# Patient Record
Sex: Male | Born: 1940 | Race: White | Hispanic: No | Marital: Married | Smoking: Never smoker
Health system: Southern US, Community
[De-identification: ages and names within clinical notes are randomized; demographics above are authoritative.]

## PROBLEM LIST (undated history)

## (undated) DIAGNOSIS — K2100 Gastro-esophageal reflux disease with esophagitis, without bleeding: Secondary | ICD-10-CM

## (undated) DIAGNOSIS — E7800 Pure hypercholesterolemia, unspecified: Secondary | ICD-10-CM

## (undated) DIAGNOSIS — R413 Other amnesia: Secondary | ICD-10-CM

## (undated) DIAGNOSIS — G250 Essential tremor: Secondary | ICD-10-CM

## (undated) DIAGNOSIS — F419 Anxiety disorder, unspecified: Secondary | ICD-10-CM

## (undated) DIAGNOSIS — N400 Enlarged prostate without lower urinary tract symptoms: Secondary | ICD-10-CM

## (undated) DIAGNOSIS — B009 Herpesviral infection, unspecified: Secondary | ICD-10-CM

## (undated) DIAGNOSIS — I10 Essential (primary) hypertension: Secondary | ICD-10-CM

## (undated) DIAGNOSIS — E119 Type 2 diabetes mellitus without complications: Secondary | ICD-10-CM

## (undated) DIAGNOSIS — R32 Unspecified urinary incontinence: Secondary | ICD-10-CM

## (undated) DIAGNOSIS — I4891 Unspecified atrial fibrillation: Secondary | ICD-10-CM

---

## 2009-09-14 ENCOUNTER — Emergency Department: Payer: Self-pay | Admitting: Emergency Medicine

## 2017-09-14 ENCOUNTER — Inpatient Hospital Stay
Admission: EM | Admit: 2017-09-14 | Discharge: 2017-09-16 | DRG: 309 | Disposition: A | Discharge status: Home / Self Care | Payer: Medicare Other | Attending: Internal Medicine | Admitting: Internal Medicine

## 2017-09-14 ENCOUNTER — Encounter: Payer: Self-pay | Admitting: Emergency Medicine

## 2017-09-14 ENCOUNTER — Emergency Department: Payer: Medicare Other

## 2017-09-14 ENCOUNTER — Other Ambulatory Visit: Payer: Self-pay

## 2017-09-14 DIAGNOSIS — Z7901 Long term (current) use of anticoagulants: Secondary | ICD-10-CM | POA: Diagnosis not present

## 2017-09-14 DIAGNOSIS — Z79899 Other long term (current) drug therapy: Secondary | ICD-10-CM | POA: Diagnosis not present

## 2017-09-14 DIAGNOSIS — E119 Type 2 diabetes mellitus without complications: Secondary | ICD-10-CM | POA: Diagnosis present

## 2017-09-14 DIAGNOSIS — I361 Nonrheumatic tricuspid (valve) insufficiency: Secondary | ICD-10-CM | POA: Diagnosis not present

## 2017-09-14 DIAGNOSIS — I248 Other forms of acute ischemic heart disease: Secondary | ICD-10-CM | POA: Diagnosis present

## 2017-09-14 DIAGNOSIS — I10 Essential (primary) hypertension: Secondary | ICD-10-CM | POA: Diagnosis present

## 2017-09-14 DIAGNOSIS — R778 Other specified abnormalities of plasma proteins: Secondary | ICD-10-CM

## 2017-09-14 DIAGNOSIS — K210 Gastro-esophageal reflux disease with esophagitis: Secondary | ICD-10-CM | POA: Diagnosis present

## 2017-09-14 DIAGNOSIS — N400 Enlarged prostate without lower urinary tract symptoms: Secondary | ICD-10-CM | POA: Diagnosis present

## 2017-09-14 DIAGNOSIS — E785 Hyperlipidemia, unspecified: Secondary | ICD-10-CM | POA: Diagnosis present

## 2017-09-14 DIAGNOSIS — G4733 Obstructive sleep apnea (adult) (pediatric): Secondary | ICD-10-CM | POA: Diagnosis present

## 2017-09-14 DIAGNOSIS — R05 Cough: Secondary | ICD-10-CM | POA: Diagnosis present

## 2017-09-14 DIAGNOSIS — Z7984 Long term (current) use of oral hypoglycemic drugs: Secondary | ICD-10-CM | POA: Diagnosis not present

## 2017-09-14 DIAGNOSIS — I4891 Unspecified atrial fibrillation: Secondary | ICD-10-CM

## 2017-09-14 DIAGNOSIS — I480 Paroxysmal atrial fibrillation: Secondary | ICD-10-CM | POA: Diagnosis not present

## 2017-09-14 DIAGNOSIS — R7989 Other specified abnormal findings of blood chemistry: Secondary | ICD-10-CM

## 2017-09-14 DIAGNOSIS — G250 Essential tremor: Secondary | ICD-10-CM | POA: Diagnosis present

## 2017-09-14 DIAGNOSIS — R059 Cough, unspecified: Secondary | ICD-10-CM

## 2017-09-14 DIAGNOSIS — J069 Acute upper respiratory infection, unspecified: Secondary | ICD-10-CM | POA: Diagnosis present

## 2017-09-14 DIAGNOSIS — R079 Chest pain, unspecified: Secondary | ICD-10-CM

## 2017-09-14 DIAGNOSIS — E7800 Pure hypercholesterolemia, unspecified: Secondary | ICD-10-CM | POA: Diagnosis present

## 2017-09-14 HISTORY — DX: Benign prostatic hyperplasia without lower urinary tract symptoms: N40.0

## 2017-09-14 HISTORY — DX: Unspecified urinary incontinence: R32

## 2017-09-14 HISTORY — DX: Type 2 diabetes mellitus without complications: E11.9

## 2017-09-14 HISTORY — DX: Herpesviral infection, unspecified: B00.9

## 2017-09-14 HISTORY — DX: Gastro-esophageal reflux disease with esophagitis: K21.0

## 2017-09-14 HISTORY — DX: Essential tremor: G25.0

## 2017-09-14 HISTORY — DX: Anxiety disorder, unspecified: F41.9

## 2017-09-14 HISTORY — DX: Other amnesia: R41.3

## 2017-09-14 HISTORY — DX: Pure hypercholesterolemia, unspecified: E78.00

## 2017-09-14 HISTORY — DX: Gastro-esophageal reflux disease with esophagitis, without bleeding: K21.00

## 2017-09-14 HISTORY — DX: Essential (primary) hypertension: I10

## 2017-09-14 HISTORY — DX: Unspecified atrial fibrillation: I48.91

## 2017-09-14 LAB — BASIC METABOLIC PANEL
Anion gap: 11 (ref 5–15)
BUN: 18 mg/dL (ref 6–20)
CO2: 26 mmol/L (ref 22–32)
CREATININE: 1.38 mg/dL — AB (ref 0.61–1.24)
Calcium: 8.6 mg/dL — ABNORMAL LOW (ref 8.9–10.3)
Chloride: 100 mmol/L — ABNORMAL LOW (ref 101–111)
GFR calc Af Amer: 56 mL/min — ABNORMAL LOW (ref >60)
GFR, EST NON AFRICAN AMERICAN: 48 mL/min — AB (ref >60)
Glucose, Bld: 124 mg/dL — ABNORMAL HIGH (ref 65–99)
Potassium: 3.7 mmol/L (ref 3.5–5.1)
SODIUM: 137 mmol/L (ref 135–145)

## 2017-09-14 LAB — URINALYSIS, COMPLETE (UACMP) WITH MICROSCOPIC
Bilirubin Urine: NEGATIVE
Glucose, UA: 50 mg/dL — AB
Hgb urine dipstick: NEGATIVE
KETONES UR: 5 mg/dL — AB
Leukocytes, UA: NEGATIVE
Nitrite: NEGATIVE
PROTEIN: 100 mg/dL — AB
SQUAMOUS EPITHELIAL / LPF: NONE SEEN
Specific Gravity, Urine: 1.009 (ref 1.005–1.030)
pH: 8 (ref 5.0–8.0)

## 2017-09-14 LAB — GLUCOSE, CAPILLARY
GLUCOSE-CAPILLARY: 120 mg/dL — AB (ref 65–99)
Glucose-Capillary: 107 mg/dL — ABNORMAL HIGH (ref 65–99)

## 2017-09-14 LAB — CBC WITH DIFFERENTIAL/PLATELET
Basophils Absolute: 0.1 10*3/uL (ref 0–0.1)
Basophils Relative: 0 %
Eosinophils Absolute: 0 10*3/uL (ref 0–0.7)
Eosinophils Relative: 0 %
HCT: 42.6 % (ref 40.0–52.0)
HEMOGLOBIN: 14.4 g/dL (ref 13.0–18.0)
LYMPHS ABS: 1.3 10*3/uL (ref 1.0–3.6)
LYMPHS PCT: 9 %
MCH: 32.9 pg (ref 26.0–34.0)
MCHC: 33.8 g/dL (ref 32.0–36.0)
MCV: 97.3 fL (ref 80.0–100.0)
Monocytes Absolute: 1 10*3/uL (ref 0.2–1.0)
Monocytes Relative: 7 %
Neutro Abs: 12.3 10*3/uL — ABNORMAL HIGH (ref 1.4–6.5)
Neutrophils Relative %: 84 %
PLATELETS: 200 10*3/uL (ref 150–440)
RBC: 4.38 MIL/uL — AB (ref 4.40–5.90)
RDW: 14.4 % (ref 11.5–14.5)
WBC: 14.7 10*3/uL — AB (ref 3.8–10.6)

## 2017-09-14 LAB — BRAIN NATRIURETIC PEPTIDE: B NATRIURETIC PEPTIDE 5: 1021 pg/mL — AB (ref 0.0–100.0)

## 2017-09-14 LAB — TROPONIN I
Troponin I: 0.05 ng/mL (ref <0.03)
Troponin I: 0.38 ng/mL (ref <0.03)
Troponin I: 0.38 ng/mL (ref <0.03)

## 2017-09-14 LAB — TSH: TSH: 2.502 u[IU]/mL (ref 0.350–4.500)

## 2017-09-14 LAB — MRSA PCR SCREENING: MRSA by PCR: NEGATIVE

## 2017-09-14 MED ORDER — MORPHINE SULFATE (PF) 2 MG/ML IV SOLN: 2.0000 mg | INTRAVENOUS | Status: DC | PRN

## 2017-09-14 MED ORDER — HYDRALAZINE HCL 50 MG PO TABS
50.0000 mg | ORAL_TABLET | Freq: Three times a day (TID) | ORAL | Status: DC
  Administered 2017-09-14 – 2017-09-16 (×6): 50 mg via ORAL
  Filled 2017-09-14 (×7): qty 1

## 2017-09-14 MED ORDER — NITROGLYCERIN 2 % TD OINT
1.0000 [in_us] | TOPICAL_OINTMENT | Freq: Four times a day (QID) | TRANSDERMAL | Status: DC
  Administered 2017-09-14 – 2017-09-15 (×3): 1 [in_us] via TOPICAL
  Filled 2017-09-14 (×4): qty 1

## 2017-09-14 MED ORDER — METOPROLOL SUCCINATE ER 50 MG PO TB24
50.0000 mg | ORAL_TABLET | Freq: Two times a day (BID) | ORAL | Status: DC
  Administered 2017-09-14 – 2017-09-15 (×2): 50 mg via ORAL
  Filled 2017-09-14 (×2): qty 1

## 2017-09-14 MED ORDER — ONDANSETRON HCL 4 MG/2ML IJ SOLN: 4.0000 mg | Freq: Four times a day (QID) | INTRAMUSCULAR | Status: DC | PRN

## 2017-09-14 MED ORDER — ACETAMINOPHEN 325 MG PO TABS: 650.0000 mg | ORAL_TABLET | Freq: Four times a day (QID) | ORAL | Status: DC | PRN

## 2017-09-14 MED ORDER — MAGNESIUM SULFATE 2 GM/50ML IV SOLN
2.0000 g | Freq: Once | INTRAVENOUS | Status: AC
  Administered 2017-09-14: 2 g via INTRAVENOUS
  Filled 2017-09-14: qty 50

## 2017-09-14 MED ORDER — DILTIAZEM HCL 100 MG IV SOLR
10.0000 mg/h | INTRAVENOUS | Status: DC
  Administered 2017-09-14 – 2017-09-15 (×2): 10 mg/h via INTRAVENOUS
  Filled 2017-09-14 (×4): qty 100

## 2017-09-14 MED ORDER — METFORMIN HCL 500 MG PO TABS
500.0000 mg | ORAL_TABLET | Freq: Every day | ORAL | Status: DC
  Administered 2017-09-15 – 2017-09-16 (×2): 500 mg via ORAL
  Filled 2017-09-14 (×2): qty 1

## 2017-09-14 MED ORDER — DOCUSATE SODIUM 100 MG PO CAPS
100.0000 mg | ORAL_CAPSULE | Freq: Two times a day (BID) | ORAL | Status: DC
  Administered 2017-09-14 – 2017-09-16 (×4): 100 mg via ORAL
  Filled 2017-09-14 (×4): qty 1

## 2017-09-14 MED ORDER — TAMSULOSIN HCL 0.4 MG PO CAPS
0.4000 mg | ORAL_CAPSULE | Freq: Two times a day (BID) | ORAL | Status: DC
  Administered 2017-09-14 – 2017-09-16 (×4): 0.4 mg via ORAL
  Filled 2017-09-14 (×4): qty 1

## 2017-09-14 MED ORDER — CITALOPRAM HYDROBROMIDE 20 MG PO TABS
40.0000 mg | ORAL_TABLET | Freq: Every day | ORAL | Status: DC
  Administered 2017-09-15 – 2017-09-16 (×2): 40 mg via ORAL
  Filled 2017-09-14 (×2): qty 2

## 2017-09-14 MED ORDER — PRIMIDONE 250 MG PO TABS
250.0000 mg | ORAL_TABLET | Freq: Two times a day (BID) | ORAL | Status: DC
  Administered 2017-09-14 – 2017-09-16 (×4): 250 mg via ORAL
  Filled 2017-09-14 (×6): qty 1

## 2017-09-14 MED ORDER — BISACODYL 10 MG RE SUPP: 10.0000 mg | Freq: Every day | RECTAL | Status: DC | PRN

## 2017-09-14 MED ORDER — OXYBUTYNIN CHLORIDE ER 5 MG PO TB24
5.0000 mg | ORAL_TABLET | Freq: Every day | ORAL | Status: DC
  Administered 2017-09-15 – 2017-09-16 (×2): 5 mg via ORAL
  Filled 2017-09-14 (×2): qty 1

## 2017-09-14 MED ORDER — LOSARTAN POTASSIUM 50 MG PO TABS
50.0000 mg | ORAL_TABLET | Freq: Two times a day (BID) | ORAL | Status: DC
  Administered 2017-09-14 – 2017-09-16 (×4): 50 mg via ORAL
  Filled 2017-09-14 (×4): qty 1

## 2017-09-14 MED ORDER — SODIUM CHLORIDE 0.9 % IV BOLUS (SEPSIS)
500.0000 mL | Freq: Once | INTRAVENOUS | Status: AC
  Administered 2017-09-14: 500 mL via INTRAVENOUS

## 2017-09-14 MED ORDER — PANTOPRAZOLE SODIUM 40 MG IV SOLR
40.0000 mg | Freq: Two times a day (BID) | INTRAVENOUS | Status: DC
  Administered 2017-09-14 – 2017-09-16 (×4): 40 mg via INTRAVENOUS
  Filled 2017-09-14 (×4): qty 40

## 2017-09-14 MED ORDER — NITROGLYCERIN 0.4 MG SL SUBL: 0.4000 mg | SUBLINGUAL_TABLET | SUBLINGUAL | Status: DC | PRN

## 2017-09-14 MED ORDER — INSULIN ASPART 100 UNIT/ML ~~LOC~~ SOLN
0.0000 [IU] | Freq: Three times a day (TID) | SUBCUTANEOUS | Status: DC
  Administered 2017-09-15: 1 [IU] via SUBCUTANEOUS
  Administered 2017-09-15 – 2017-09-16 (×2): 2 [IU] via SUBCUTANEOUS
  Filled 2017-09-14 (×3): qty 1

## 2017-09-14 MED ORDER — RIVAROXABAN 20 MG PO TABS
20.0000 mg | ORAL_TABLET | Freq: Every day | ORAL | Status: DC
  Administered 2017-09-15 – 2017-09-16 (×2): 20 mg via ORAL
  Filled 2017-09-14 (×2): qty 1

## 2017-09-14 MED ORDER — METOPROLOL TARTRATE 5 MG/5ML IV SOLN
5.0000 mg | Freq: Once | INTRAVENOUS | Status: AC
  Administered 2017-09-14: 5 mg via INTRAVENOUS
  Filled 2017-09-14: qty 5

## 2017-09-14 MED ORDER — IPRATROPIUM-ALBUTEROL 0.5-2.5 (3) MG/3ML IN SOLN
3.0000 mL | Freq: Four times a day (QID) | RESPIRATORY_TRACT | Status: DC
  Administered 2017-09-14 – 2017-09-15 (×4): 3 mL via RESPIRATORY_TRACT
  Filled 2017-09-14 (×4): qty 3

## 2017-09-14 MED ORDER — EZETIMIBE 10 MG PO TABS
10.0000 mg | ORAL_TABLET | Freq: Every day | ORAL | Status: DC
  Administered 2017-09-15 – 2017-09-16 (×2): 10 mg via ORAL
  Filled 2017-09-14 (×2): qty 1

## 2017-09-14 MED ORDER — DILTIAZEM HCL 100 MG IV SOLR
5.0000 mg/h | Freq: Once | INTRAVENOUS | Status: AC
  Administered 2017-09-14: 5 mg/h via INTRAVENOUS
  Filled 2017-09-14: qty 100

## 2017-09-14 MED ORDER — ONDANSETRON HCL 4 MG PO TABS: 4.0000 mg | ORAL_TABLET | Freq: Four times a day (QID) | ORAL | Status: DC | PRN

## 2017-09-14 MED ORDER — ACETAMINOPHEN 650 MG RE SUPP: 650.0000 mg | Freq: Four times a day (QID) | RECTAL | Status: DC | PRN

## 2017-09-14 MED ORDER — FLECAINIDE ACETATE 50 MG PO TABS
50.0000 mg | ORAL_TABLET | Freq: Two times a day (BID) | ORAL | Status: DC
  Filled 2017-09-14: qty 1

## 2017-09-14 MED ORDER — DONEPEZIL HCL 5 MG PO TABS
10.0000 mg | ORAL_TABLET | Freq: Every day | ORAL | Status: DC
  Administered 2017-09-15 – 2017-09-16 (×2): 10 mg via ORAL
  Filled 2017-09-14 (×2): qty 2

## 2017-09-14 MED ORDER — POTASSIUM CHLORIDE IN NACL 20-0.9 MEQ/L-% IV SOLN
INTRAVENOUS | Status: DC
  Administered 2017-09-14: 17:00:00 via INTRAVENOUS
  Filled 2017-09-14 (×4): qty 1000

## 2017-09-14 NOTE — ED Provider Notes (Addendum)
[facility] Emergency Department Provider Note       Time seen: ----------------------------------------- 11:17 AM on 09/14/2017 -----------------------------------------   I have reviewed the triage vital signs and the nursing notes.  HISTORY   Chief Complaint URI    HPI Lucas Foster is a 76 y.o. male with a history of atrial fibrillation who presents to the ED for cough and cold symptoms.  [facility] and he is currently [place] from [place].  [facility] he was placed on Flonase and states he is not feeling any better.  He is still complaining of chest tightness and some cough.  EMS did note that his heart was intermittently racing, he reports taking all his medications today.  His symptoms have been present for 2 days.  No past medical history on file.  There is a reported history of atrial fibrillation, hypertension, anxiety, diabetes and hyperlipidemia  There are no active problems to display for this patient.     Allergies Patient has no allergy information on record.  Social History Social History   Tobacco Use  . Smoking status: Not on file  Substance Use Topics  . Alcohol use: Not on file  . Drug use: Not on file    Review of Systems Constitutional: Negative for fever. Eyes: Negative for vision changes ENT:  Negative for congestion, sore throat Cardiovascular: Negative for chest pain. Respiratory: Positive for cough and shortness of breath Gastrointestinal: Negative for abdominal pain, vomiting and diarrhea. Genitourinary: Negative for dysuria. Musculoskeletal: Negative for back pain. Skin: Negative for rash. Neurological: Negative for headaches, focal weakness or numbness.  All systems negative/normal/unremarkable except as stated in the HPI  ____________________________________________   PHYSICAL EXAM:  VITAL SIGNS: ED Triage Vitals  Enc Vitals Group     BP      Pulse      Resp      Temp       Temp src      SpO2      Weight      Height      Head Circumference      Peak Flow      Pain Score      Pain Loc      Pain Edu?      Excl. in GC?     Constitutional: Alert and oriented. Well appearing and in no distress. Eyes: Conjunctivae are normal. Normal extraocular movements. ENT   Head: Normocephalic and atraumatic.   Nose: No congestion/rhinnorhea.   Mouth/Throat: Mucous membranes are moist.   Neck: No stridor. Cardiovascular: Rapid rate, irregular rhythm. No murmurs, rubs, or gallops. Respiratory: Normal respiratory effort without tachypnea nor retractions. Breath sounds are clear and equal bilaterally. No wheezes/rales/rhonchi. Gastrointestinal: Soft and nontender. Normal bowel sounds Musculoskeletal: Nontender with normal range of motion in extremities. No lower extremity tenderness nor edema. Neurologic:  Normal speech and language. No gross focal neurologic deficits are appreciated.  Skin:  Skin is warm, dry and intact. No rash noted. Psychiatric: Mood and affect are normal. Speech and behavior are normal.  ____________________________________________  EKG: Interpreted by me.  A. fib with a rate of 138 bpm, LVH, left axis deviation  ____________________________________________  ED COURSE:  Pertinent labs & imaging results that were available during my care of the patient were reviewed by me and considered in my medical decision making (see chart for details). Patient presents for cough and weakness, we will assess with labs and imaging as indicated.  Procedures ____________________________________________   LABS (pertinent positives/negatives)  Labs Reviewed  CBC WITH DIFFERENTIAL/PLATELET - Abnormal; Notable for the following components:      Result Value   WBC 14.7 (*)    RBC 4.38 (*)    Neutro Abs 12.3 (*)    All other components within normal limits  BASIC METABOLIC PANEL - Abnormal; Notable for the following components:   Chloride  100 (*)    Glucose, Bld 124 (*)    Creatinine, Ser 1.38 (*)    Calcium 8.6 (*)    GFR calc non Af Amer 48 (*)    GFR calc Af Amer 56 (*)    All other components within normal limits  BRAIN NATRIURETIC PEPTIDE - Abnormal; Notable for the following components:   B Natriuretic Peptide 1,021.0 (*)    All other components within normal limits  TROPONIN I - Abnormal; Notable for the following components:   Troponin I 0.05 (*)    All other components within normal limits  URINALYSIS, COMPLETE (UACMP) WITH MICROSCOPIC   CRITICAL CARE Performed by: Emily FilbertWilliams, Taralynn Quiett E   Total critical care time: 30 minutes  Critical care time was exclusive of separately billable procedures and treating other patients.  Critical care was necessary to treat or prevent imminent or life-threatening deterioration.  Critical care was time spent personally by me on the following activities: development of treatment plan with patient and/or surrogate as well as nursing, discussions with consultants, evaluation of patient's response to treatment, examination of patient, obtaining history from patient or surrogate, ordering and performing treatments and interventions, ordering and review of laboratory studies, ordering and review of radiographic studies, pulse oximetry and re-evaluation of patient's condition.  RADIOLOGY Images were viewed by me  Chest x-ray IMPRESSION: Chronic lung changes without evidence of superimposed acute cardiopulmonary disease. ____________________________________________  DIFFERENTIAL DIAGNOSIS   URI, pneumonia, unstable angina, PE, pneumothorax, dysrhythmia  FINAL ASSESSMENT AND PLAN  Cough, atrial fibrillation with a rapid ventricular response   Plan: Patient had presented for cough and weakenss. Patient's labs did reveal a slightly elevated troponin and BNP.  Unclear etiology for his elevated white blood cell count patient's imaging was reassuring.  Over his ER course his  heart rate remained elevated in the 120s-140s despite an additional dose of Lopressor and IV fluids.  I have discussed with cardiology and we will need to admit on telemetry while on a Cardizem drip.   Emily FilbertWilliams, Brandis Matsuura E, MD   Note: This note was generated in part or whole with voice recognition software. Voice recognition is usually quite accurate but there are transcription errors that can and very often do occur. I apologize for any typographical errors that were not detected and corrected.     Emily FilbertWilliams, Sigourney Portillo E, MD 09/14/17 1408    Emily FilbertWilliams, Cathlin Buchan E, MD 09/14/17 [phone]

## 2017-09-14 NOTE — Progress Notes (Addendum)
Spoked to Dr. Ladona Ridgelaylor with Cardiology- notified of troponin of .38- also that patient's heartrate 125 A fib- MD ordered to give 5mg  of Cardizem bolus over 5 minutes and to discontinue the flecainide.  Also to keep rate of Cardizem at 10mg .

## 2017-09-14 NOTE — Progress Notes (Signed)
eLink Physician-Brief Progress Note Patient Name: Lucas Foster DOB: January 28, 1941 MRN: [id number]   Date of Service  09/14/2017  HPI/Events of Note    eICU Interventions  Entered orders for continuous diltiazem      Intervention Category Major Interventions: Arrhythmia - evaluation and management  Leslye PeerRobert S Stephane Junkins 09/14/2017, 8:34 PM

## 2017-09-14 NOTE — ED Triage Notes (Signed)
Pt visiting from [place] - seen at [facility] yesterday and dx with uri and put on flonase. States not feeling any better. Hx of afib

## 2017-09-14 NOTE — Plan of Care (Signed)
Pt continues to be in a-fib with the cardizem gtt.

## 2017-09-14 NOTE — H&P (Signed)
History and Physical    Lucas PushMark Rogue WUJ:[id number] DOB: [birth year] DOA: 09/14/2017  Referring physician: Dr. Mayford KnifeWilliams PCP: System, Provider Not In  Specialists: non-local  Chief Complaint: CP with cough and SOB and palpitations  HPI: Lucas Foster is a 76 y.o. male has a past medical history significant for PAF, HTN, DM, and HLD visiting from [place] who presents to ER with palpitations associated with CP, SOB, and cough. In ER, pt noted to be in rapid A-fib with mildly elevated troponin. BP elevated. Pt is diaphoretic. He is now admitted. No fever. Denies syncope or near syncope. Did take an extra Flecanide at home with no improvement.  Review of Systems: The patient denies anorexia, fever, weight loss,, vision loss, decreased hearing, hoarseness,  syncope,  peripheral edema, balance deficits, hemoptysis, abdominal pain, melena, hematochezia, severe indigestion/heartburn, hematuria, incontinence, genital sores, muscle weakness, suspicious skin lesions, transient blindness, difficulty walking, depression, unusual weight change, abnormal bleeding, enlarged lymph nodes, angioedema, and breast masses.   Past Medical History:  Diagnosis Date  . A-fib (HCC)   . Anxiety   . Diabetes mellitus without complication (HCC)   . Essential tremor   . Herpes simplex   . High cholesterol   . Hypertension   . Incontinence   . Memory loss   . 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 Reflux esophagitis    History reviewed. No pertinent surgical history. Social History:  reports that  has never smoked. he has never used smokeless tobacco. His alcohol and drug histories are not on file.  Allergies  Allergen Reactions  . Furosemide   . Norvasc [Amlodipine Besylate]     rash  . Rocephin [Ceftriaxone Sodium In Dextrose]     rash  . Spironolactone     rash  . Valsartan   . Vancomycin     Kidney failure    History reviewed. No pertinent family history.  Prior to Admission  medications   Medication Sig Start Date End Date Taking? Authorizing Provider  citalopram (CELEXA) 40 MG tablet Take 40 mg by mouth daily.   Yes [provider]  cloNIDine (CATAPRES) 0.1 MG tablet Take 0.1 mg by mouth as needed. Take if blood pressure > 180. 09/04/17  Yes [provider]  donepezil (ARICEPT) 10 MG tablet Take 10 mg by mouth daily. 07/30/17  Yes [provider]  esomeprazole (NEXIUM) 40 MG capsule Take 40 mg by mouth 2 (two) times daily. 07/17/17  Yes [provider]  ezetimibe (ZETIA) 10 MG tablet Take 10 mg by mouth daily.   Yes [provider]  flecainide (TAMBOCOR) 150 MG tablet Take 150 mg by mouth as needed. If Afib, take 1 tablet. If Afib doesn't stop in 2 hours, go to ER.   Yes [provider]  flecainide (TAMBOCOR) 50 MG tablet Take 50 mg by mouth 2 (two) times daily.   Yes [provider]  hydrALAZINE (APRESOLINE) 50 MG tablet Take 50 mg by mouth 3 (three) times daily. 09/02/17  Yes [provider]  isosorbide mononitrate (IMDUR) 60 MG 24 hr tablet Take 60 mg by mouth daily. 08/06/17  Yes [provider]  losartan (COZAAR) 50 MG tablet Take 50 mg by mouth 2 (two) times daily. 09/10/17  Yes [provider]  metFORMIN (GLUCOPHAGE) 500 MG tablet Take 500 mg by mouth daily. 07/17/17  Yes [provider]  metoprolol succinate (TOPROL-XL) 50 MG 24 hr tablet Take 50 mg by mouth 2 (two) times daily.  Yes [provider]  oxybutynin (DITROPAN-XL) 5 MG 24 hr tablet Take 5 mg by mouth daily. 09/10/17  Yes [provider]  primidone (MYSOLINE) 250 MG tablet Take 250 mg by mouth 2 (two) times daily. 06/27/17  Yes [provider]  tamsulosin (FLOMAX) 0.4 MG CAPS capsule Take 0.4 mg by mouth 2 (two) times daily. 08/12/17  Yes [provider]  valACYclovir (VALTREX) 500 MG tablet Take 500 mg by mouth daily as needed.   Yes [provider]  XARELTO 20  MG TABS tablet Take 20 mg by mouth daily. 07/17/17  Yes [provider]   Physical Exam: Vitals:   09/14/17 1230 09/14/17 1300 09/14/17 1330 09/14/17 1400  BP: (!) 143/109 (!) 154/91 (!) 140/98 (!) 158/102  Pulse: (!) 134 (!) 126 66 (!) 136  Resp: 20 (!) 25 19 (!) 25  SpO2: 94% 92% 95% 92%  Weight:      Height:         General:  No apparent distress, WDWN, [place]/AT  Eyes: PERRL, EOMI, no scleral icterus, conjunctiva clear  ENT: moist oropharynx without exudate, TM's benign, dentition good  Neck: supple, no lymphadenopathy. No bruits or thyromegaly  Cardiovascular: irregularly irregular without MRG; 2+ peripheral pulses, no JVD, no peripheral edema  Respiratory: basilar rales without wheezes or rhonchi. No dullness. Respiratory effort is normal  Abdomen: soft, non tender to palpation, positive bowel sounds, no guarding, no rebound  Skin: no rashes or lesions  Musculoskeletal: normal bulk and tone, no joint swelling  Psychiatric: normal mood and affect, A&OX3  Neurologic: CN 2-12 grossly intact, Motor strength 5/5 in all 4 groups with symmetric DTR's and non-focal sensory exam  Labs on Admission:  Basic Metabolic Panel: Recent Labs  Lab 09/14/17 1142  NA 137  K 3.7  CL 100*  CO2 26  GLUCOSE 124*  BUN 18  CREATININE 1.38*  CALCIUM 8.6*   Liver Function Tests: No results for input(s): AST, ALT, ALKPHOS, BILITOT, PROT, ALBUMIN in the last 168 hours. No results for input(s): LIPASE, AMYLASE in the last 168 hours. No results for input(s): AMMONIA in the last 168 hours. CBC: Recent Labs  Lab 09/14/17 1142  WBC 14.7*  NEUTROABS 12.3*  HGB 14.4  HCT 42.6  MCV 97.3  PLT 200   Cardiac Enzymes: Recent Labs  Lab 09/14/17 1142  TROPONINI 0.05*    BNP (last 3 results) Recent Labs    09/14/17 1142  BNP 1,021.0*    ProBNP (last 3 results) No results for input(s): PROBNP in the last 8760 hours.  CBG: No results for input(s): GLUCAP in the last 168  hours.  Radiological Exams on Admission: Dg Chest 2 View  Result Date: 09/14/2017 CLINICAL DATA:  76 year old male with a history of urinary tract infection EXAM: CHEST  2 VIEW COMPARISON:  09/14/2009 FINDINGS: Cardiomediastinal silhouette unchanged in size and contour. No evidence of central vascular congestion. No pneumothorax or pleural effusion. No interlobular septal thickening. No confluent airspace disease. Diffuse coarsening of interstitial markings. No displaced fracture. IMPRESSION: Chronic lung changes without evidence of superimposed acute cardiopulmonary disease. Electronically Signed   By: Gilmer Mor D.O.   On: 09/14/2017 11:43    EKG: Independently reviewed.  Assessment/Plan Principal Problem:   Atrial fibrillation with RVR (HCC) Active Problems:   Chest pain   Elevated troponin   Accelerated hypertension   Will admit to Stepdown with IV diltiazem drip. Give empiric IV magnesium. Follow sugars. Follow enzymes. Echo and Cardiology consult ordered.  Will adjust outpatient meds as well.  Diet: low salt, low carb Fluids: NS@75  with K+ DVT Prophylaxis: Xarelto  Code Status: FULL  Family Communication: yes  Disposition Plan: home  Time spent: 50 min

## 2017-09-15 DIAGNOSIS — I4891 Unspecified atrial fibrillation: Secondary | ICD-10-CM

## 2017-09-15 LAB — COMPREHENSIVE METABOLIC PANEL
ALBUMIN: 3.5 g/dL (ref 3.5–5.0)
ALK PHOS: 65 U/L (ref 38–126)
ALT: 17 U/L (ref 17–63)
AST: 29 U/L (ref 15–41)
Anion gap: 8 (ref 5–15)
BUN: 22 mg/dL — ABNORMAL HIGH (ref 6–20)
CALCIUM: 8.2 mg/dL — AB (ref 8.9–10.3)
CO2: 25 mmol/L (ref 22–32)
CREATININE: 1.18 mg/dL (ref 0.61–1.24)
Chloride: 104 mmol/L (ref 101–111)
GFR calc non Af Amer: 58 mL/min — ABNORMAL LOW (ref >60)
GLUCOSE: 122 mg/dL — AB (ref 65–99)
Potassium: 3.5 mmol/L (ref 3.5–5.1)
SODIUM: 137 mmol/L (ref 135–145)
Total Bilirubin: 0.8 mg/dL (ref 0.3–1.2)
Total Protein: 6.8 g/dL (ref 6.5–8.1)

## 2017-09-15 LAB — CBC
HEMATOCRIT: 40.6 % (ref 40.0–52.0)
HEMOGLOBIN: 13.7 g/dL (ref 13.0–18.0)
MCH: 32.6 pg (ref 26.0–34.0)
MCHC: 33.8 g/dL (ref 32.0–36.0)
MCV: 96.5 fL (ref 80.0–100.0)
Platelets: 172 10*3/uL (ref 150–440)
RBC: 4.2 MIL/uL — ABNORMAL LOW (ref 4.40–5.90)
RDW: 13.9 % (ref 11.5–14.5)
WBC: 13 10*3/uL — AB (ref 3.8–10.6)

## 2017-09-15 LAB — GLUCOSE, CAPILLARY
GLUCOSE-CAPILLARY: 159 mg/dL — AB (ref 65–99)
Glucose-Capillary: 117 mg/dL — ABNORMAL HIGH (ref 65–99)
Glucose-Capillary: 149 mg/dL — ABNORMAL HIGH (ref 65–99)
Glucose-Capillary: 77 mg/dL (ref 65–99)

## 2017-09-15 LAB — TROPONIN I: TROPONIN I: 0.27 ng/mL — AB (ref <0.03)

## 2017-09-15 MED ORDER — HYDRALAZINE HCL 20 MG/ML IJ SOLN
10.0000 mg | Freq: Four times a day (QID) | INTRAMUSCULAR | Status: DC | PRN
  Administered 2017-09-16: 10 mg via INTRAVENOUS
  Filled 2017-09-15: qty 1

## 2017-09-15 MED ORDER — IPRATROPIUM-ALBUTEROL 0.5-2.5 (3) MG/3ML IN SOLN: 3.0000 mL | RESPIRATORY_TRACT | Status: DC | PRN

## 2017-09-15 MED ORDER — STERILE WATER FOR INJECTION IJ SOLN
INTRAMUSCULAR | Status: AC
  Administered 2017-09-15: 10 mL
  Filled 2017-09-15: qty 10

## 2017-09-15 MED ORDER — METOPROLOL TARTRATE 50 MG PO TABS
50.0000 mg | ORAL_TABLET | Freq: Four times a day (QID) | ORAL | Status: DC
  Administered 2017-09-15 – 2017-09-16 (×4): 50 mg via ORAL
  Filled 2017-09-15 (×4): qty 1

## 2017-09-15 NOTE — Progress Notes (Signed)
[employer] - [place] [facility]   PATIENT NAME: Lucas Foster    MR#:  [id number]  DATE OF BIRTH:  [birth year]  SUBJECTIVE:  CHIEF COMPLAINT:   Chief Complaint  Patient presents with  . URI  feels better, HR in 100-110s at rest REVIEW OF SYSTEMS:  Review of Systems  Constitutional: Negative for chills, fever and weight loss.  HENT: Negative for nosebleeds and sore throat.   Eyes: Negative for blurred vision.  Respiratory: Negative for cough, shortness of breath and wheezing.   Cardiovascular: Positive for palpitations. Negative for chest pain, orthopnea, leg swelling and PND.  Gastrointestinal: Negative for abdominal pain, constipation, diarrhea, heartburn, nausea and vomiting.  Genitourinary: Negative for dysuria and urgency.  Musculoskeletal: Negative for back pain.  Skin: Negative for rash.  Neurological: Negative for dizziness, speech change, focal weakness and headaches.  Endo/Heme/Allergies: Does not bruise/bleed easily.  Psychiatric/Behavioral: Negative for depression.    DRUG ALLERGIES:   Allergies  Allergen Reactions  . Furosemide   . Norvasc [Amlodipine Besylate]     rash  . Rocephin [Ceftriaxone Sodium In Dextrose]     rash  . Spironolactone     rash  . Valsartan   . Vancomycin     Kidney failure   VITALS:  Blood pressure 135/86, pulse 95, temperature 97.6 F (36.4 C), temperature source Oral, resp. rate 16, height 6\' 2"  (1.88 m), weight 114.9 kg (253 lb 4.9 oz), SpO2 97 %. PHYSICAL EXAMINATION:  Physical Exam  Constitutional: He is oriented to person, place, and time and well-developed, well-nourished, and in no distress.  HENT:  Head: Normocephalic and atraumatic.  Eyes: Conjunctivae and EOM are normal. Pupils are equal, round, and reactive to light.  Neck: Normal range of motion. Neck supple. No tracheal deviation present. No thyromegaly present.  Cardiovascular: Normal heart sounds. An irregularly irregular rhythm present.  Tachycardia present.  Pulmonary/Chest: Effort normal and breath sounds normal. No respiratory distress. He has no wheezes. He exhibits no tenderness.  Abdominal: Soft. Bowel sounds are normal. He exhibits no distension. There is no tenderness.  Musculoskeletal: Normal range of motion.  Neurological: He is alert and oriented to person, place, and time. No cranial nerve deficit.  Skin: Skin is warm and dry. No rash noted.  Psychiatric: Mood and affect normal.   LABORATORY PANEL:  Male CBC Recent Labs  Lab 09/15/17 0403  WBC 13.0*  HGB 13.7  HCT 40.6  PLT 172   ------------------------------------------------------------------------------------------------------------------ Chemistries  Recent Labs  Lab 09/15/17 0403  NA 137  K 3.5  CL 104  CO2 25  GLUCOSE 122*  BUN 22*  CREATININE 1.18  CALCIUM 8.2*  AST 29  ALT 17  ALKPHOS 65  BILITOT 0.8   RADIOLOGY:  No results found. ASSESSMENT AND PLAN:  76 y.o. male has a past medical history significant for PAF, HTN, DM, and HLD visiting from [place] admitted for palpitations associated with CP, SOB, and cough.   * A.fib with RVR: - rate well controlled with IV cardizem - Cardio c/s, echo  * HTN - BP is better controlled. - Continue dilt and ARB,metoprolol  * Troponin  Minimal elevation due to supply demand ischemia  * Lipids: On Zetia   * DM: continue metformin. SSI     All the records are reviewed and case discussed with Care Management/Social Worker. Management plans discussed with the patient, family and they are in agreement.  CODE STATUS: Full Code  TOTAL TIME TAKING CARE OF THIS PATIENT:  45 minutes.   More than 50% of the time was spent in counseling/coordination of care: YES  POSSIBLE D/C IN 1-2 DAYS, DEPENDING ON CLINICAL CONDITION.   Delfino LovettVipul Iliani Vejar M.D on 09/15/2017 at 2:45 PM  Between 7am to 6pm - Pager - [phone]  After 6pm go to [email or web address] - Social research officer, governmentpassword [email or web address]  [facility]  [facility]  Office  [phone]  CC: Primary care physician; System, Provider Not In  Note: This dictation was prepared with Dragon dictation along with smaller phrase technology. Any transcriptional errors that result from this process are unintentional.

## 2017-09-15 NOTE — Progress Notes (Signed)
Pt has remained alert and oriented with no c/o pain. Pt remains on RA-SpO2 96% on RA-lung sounds clear/diminished to ascultation. A.Fib in low 100's on cardiac monitor. Pt with ongoing HTN this am-PRN ordered. Pt wife, Okey RegalCarol called and updated this am.

## 2017-09-15 NOTE — H&P (Addendum)
Primary Physician:  [place]   Primary Cardiologist:  New   Seen in [place] by Dr Jeral FruitGongidi ([place])  Asked to see by Dr Judithann SheenSparks for atrial fib with RVR  HPI: Pt is a 76 yo with history of PAF, HTN, DM and HL  [place]   [facility] he went to [facility]gent [facility]  Thought he was getting URI  Did not use OTC cold meds   No comment on pulse made Woke up Saturday feeling bad  Pulse up  [place] SpanielSaid he knew he was in afib  Came to ED with CP, SOB and palpitatins Found to be in rapid afib  The pt and his wife say that he was first told he had afib when he was in the hospital in 2016 (for MRSA infection of knee)  He as cardioverted at that time   [facility], Dr Hayes LudwigV Gongidi)   May have tried different meds for afib and BP control At some point placed on  He has been maintained on Flecanide 50 bid  Has had breaktrhough  In April 2018 was given Rx fo 150 Flecanide to take as needed  He has been hhosp 3 times because of afib with weakness between 2016 and now    The pt has a history Sleep apnea Last seen for this in 2006  Did not tolerate mask at time    Currently pt denies SOB or CP    Admitted to IM  Troponins 0.38, 0.38, 0.27         Past Medical History:  Diagnosis Date  . A-fib (HCC)   . Anxiety   . Diabetes mellitus without complication (HCC)   . Essential tremor   . Herpes simplex   . High cholesterol   . Hypertension   . Incontinence   . Memory loss   . 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 Reflux esophagitis     Medications Prior to Admission  Medication Sig Dispense Refill  . citalopram (CELEXA) 40 MG tablet Take 40 mg by mouth daily.    . cloNIDine (CATAPRES) 0.1 MG tablet Take 0.1 mg by mouth as needed. Take if blood pressure > 180.    Marland Kitchen. donepezil (ARICEPT) 10 MG tablet Take 10 mg by mouth daily.    Marland Kitchen. esomeprazole (NEXIUM) 40 MG capsule Take 40 mg by mouth 2 (two) times daily.    Marland Kitchen. ezetimibe (ZETIA) 10 MG tablet Take 10  mg by mouth daily.    . flecainide (TAMBOCOR) 150 MG tablet Take 150 mg by mouth as needed. If Afib, take 1 tablet. If Afib doesn't stop in 2 hours, go to ER.    . flecainide (TAMBOCOR) 50 MG tablet Take 50 mg by mouth 2 (two) times daily.    . hydrALAZINE (APRESOLINE) 50 MG tablet Take 50 mg by mouth 3 (three) times daily.    . isosorbide mononitrate (IMDUR) 60 MG 24 hr tablet Take 60 mg by mouth daily.    Marland Kitchen. losartan (COZAAR) 50 MG tablet Take 50 mg by mouth 2 (two) times daily.    . metFORMIN (GLUCOPHAGE) 500 MG tablet Take 500 mg by mouth daily.    . metoprolol succinate (TOPROL-XL) 50 MG 24 hr tablet Take 50 mg by mouth 2 (two) times daily.    Marland Kitchen. oxybutynin (DITROPAN-XL) 5 MG 24 hr tablet Take 5 mg by mouth daily.    . primidone (MYSOLINE) 250 MG tablet Take 250  mg by mouth 2 (two) times daily.    . tamsulosin (FLOMAX) 0.4 MG CAPS capsule Take 0.4 mg by mouth 2 (two) times daily.    . valACYclovir (VALTREX) 500 MG tablet Take 500 mg by mouth daily as needed.    Carlena Hurl 20 MG TABS tablet Take 20 mg by mouth daily.       . citalopram  40 mg Oral Daily  . docusate sodium  100 mg Oral BID  . donepezil  10 mg Oral Daily  . ezetimibe  10 mg Oral Daily  . hydrALAZINE  50 mg Oral TID  . insulin aspart  0-9 Units Subcutaneous TID WC  . ipratropium-albuterol  3 mL Nebulization QID  . losartan  50 mg Oral BID  . metFORMIN  500 mg Oral Q breakfast  . metoprolol succinate  50 mg Oral BID  . nitroGLYCERIN  1 inch Topical Q6H  . oxybutynin  5 mg Oral Daily  . pantoprazole (PROTONIX) IV  40 mg Intravenous Q12H  . primidone  250 mg Oral BID  . rivaroxaban  20 mg Oral Daily  . tamsulosin  0.4 mg Oral BID    Infusions: . 0.9 % NaCl with KCl 20 mEq / L 75 mL/hr at 09/15/17 0700  . diltiazem (CARDIZEM) infusion 10 mg/hr (09/15/17 0700)    Allergies  Allergen Reactions  . Furosemide   . Norvasc [Amlodipine Besylate]     rash  . Rocephin [Ceftriaxone Sodium In Dextrose]     rash  .  Spironolactone     rash  . Valsartan   . Vancomycin     Kidney failure    Social History   Socioeconomic History  . Marital status: Married    Spouse name: Not on file  . Number of children: Not on file  . Years of education: Not on file  . Highest education level: Not on file  Social Needs  . Financial resource strain: Not on file  . Food insecurity - worry: Not on file  . Food insecurity - inability: Not on file  . Transportation needs - medical: Not on file  . Transportation needs - non-medical: Not on file  Occupational History  . Not on file  Tobacco Use  . Smoking status: Never Smoker  . Smokeless tobacco: Never Used  Substance and Sexual Activity  . Alcohol use: Not on file  . Drug use: Not on file  . Sexual activity: Not on file  Other Topics Concern  . Not on file  Social History Narrative  . Not on file    History reviewed. No pertinent family history.  REVIEW OF SYSTEMS:  All systems reviewed  Negative to the above problem except as noted above.    PHYSICAL EXAM: Vitals:   09/15/17 0908 09/15/17 1000  BP: (!) 137/101 (!) 143/82  Pulse:  (!) 102  Resp:  17  Temp:    SpO2:  94%     Intake/Output Summary (Last 24 hours) at 09/15/2017 1043 Last data filed at 09/15/2017 1000 Gross per 24 hour  Intake 1691.25 ml  Output 1201 ml  Net 490.25 ml    General:  Well appearing. No respiratory difficulty HEENT: normal Neck: supple. no JVD. Carotids 2+ bilat; no bruits. No lymphadenopathy or thryomegaly appreciated. Cor: PMI nondisplaced.  Irregular rate & rhythm. No rubs, gallops or murmurs. Lungs: clear Abdomen: soft, nontender, nondistended. No hepatosplenomegaly. No bruits or masses. Good bowel sounds. Extremities: no cyanosis, clubbing, rash, edema   L  knee with scar and evid skin graft   Neuro: alert & oriented x 3, cranial nerves grossly intact. moves all 4 extremities w/o difficulty. Affect pleasant.  ECG:  Atrial fib 138 bpm  Nonspecific ST  changes   Tele:  Afib 100  Results for orders placed or performed during the hospital encounter of 09/14/17 (from the past 24 hour(s))  CBC with Differential     Status: Abnormal   Collection Time: 09/14/17 11:42 AM  Result Value Ref Range   WBC 14.7 (H) 3.8 - 10.6 K/uL   RBC 4.38 (L) 4.40 - 5.90 MIL/uL   Hemoglobin 14.4 13.0 - 18.0 g/dL   HCT 40.942.6 81.140.0 - 91.452.0 %   MCV 97.3 80.0 - 100.0 fL   MCH 32.9 26.0 - 34.0 pg   MCHC 33.8 32.0 - 36.0 g/dL   RDW 78.214.4 95.611.5 - 21.314.5 %   Platelets 200 150 - 440 K/uL   Neutrophils Relative % 84 %   Neutro Abs 12.3 (H) 1.4 - 6.5 K/uL   Lymphocytes Relative 9 %   Lymphs Abs 1.3 1.0 - 3.6 K/uL   Monocytes Relative 7 %   Monocytes Absolute 1.0 0.2 - 1.0 K/uL   Eosinophils Relative 0 %   Eosinophils Absolute 0.0 0 - 0.7 K/uL   Basophils Relative 0 %   Basophils Absolute 0.1 0 - 0.1 K/uL  Basic metabolic panel     Status: Abnormal   Collection Time: 09/14/17 11:42 AM  Result Value Ref Range   Sodium 137 135 - 145 mmol/L   Potassium 3.7 3.5 - 5.1 mmol/L   Chloride 100 (L) 101 - 111 mmol/L   CO2 26 22 - 32 mmol/L   Glucose, Bld 124 (H) 65 - 99 mg/dL   BUN 18 6 - 20 mg/dL   Creatinine, Ser 0.861.38 (H) 0.61 - 1.24 mg/dL   Calcium 8.6 (L) 8.9 - 10.3 mg/dL   GFR calc non Af Amer 48 (L) >60 mL/min   GFR calc Af Amer 56 (L) >60 mL/min   Anion gap 11 5 - 15  Brain natriuretic peptide     Status: Abnormal   Collection Time: 09/14/17 11:42 AM  Result Value Ref Range   B Natriuretic Peptide 1,021.0 (H) 0.0 - 100.0 pg/mL  Troponin I     Status: Abnormal   Collection Time: 09/14/17 11:42 AM  Result Value Ref Range   Troponin I 0.05 (HH) <0.03 ng/mL  Urinalysis, Complete w Microscopic     Status: Abnormal   Collection Time: 09/14/17  1:50 PM  Result Value Ref Range   Color, Urine YELLOW (A) YELLOW   APPearance HAZY (A) CLEAR   Specific Gravity, Urine 1.009 1.005 - 1.030   pH 8.0 5.0 - 8.0   Glucose, UA 50 (A) NEGATIVE mg/dL   Hgb urine dipstick NEGATIVE  NEGATIVE   Bilirubin Urine NEGATIVE NEGATIVE   Ketones, ur 5 (A) NEGATIVE mg/dL   Protein, ur 578100 (A) NEGATIVE mg/dL   Nitrite NEGATIVE NEGATIVE   Leukocytes, UA NEGATIVE NEGATIVE   RBC / HPF 0-5 0 - 5 RBC/hpf   WBC, UA 6-30 0 - 5 WBC/hpf   Bacteria, UA RARE (A) NONE SEEN   Squamous Epithelial / LPF NONE SEEN NONE SEEN   Mucus PRESENT    Budding Yeast PRESENT    Sperm, UA PRESENT   MRSA PCR Screening     Status: None   Collection Time: 09/14/17  4:14 PM  Result Value Ref Range  MRSA by PCR NEGATIVE NEGATIVE  Glucose, capillary     Status: Abnormal   Collection Time: 09/14/17  4:18 PM  Result Value Ref Range   Glucose-Capillary 107 (H) 65 - 99 mg/dL  TSH     Status: None   Collection Time: 09/14/17  4:55 PM  Result Value Ref Range   TSH 2.502 0.350 - 4.500 uIU/mL  Troponin I     Status: Abnormal   Collection Time: 09/14/17  4:55 PM  Result Value Ref Range   Troponin I 0.38 (HH) <0.03 ng/mL  Troponin I     Status: Abnormal   Collection Time: 09/14/17  9:49 PM  Result Value Ref Range   Troponin I 0.38 (HH) <0.03 ng/mL  Glucose, capillary     Status: Abnormal   Collection Time: 09/14/17  9:55 PM  Result Value Ref Range   Glucose-Capillary 120 (H) 65 - 99 mg/dL   Comment 1 Notify RN    Comment 2 Document in Chart   Troponin I     Status: Abnormal   Collection Time: 09/15/17  4:03 AM  Result Value Ref Range   Troponin I 0.27 (HH) <0.03 ng/mL  CBC     Status: Abnormal   Collection Time: 09/15/17  4:03 AM  Result Value Ref Range   WBC 13.0 (H) 3.8 - 10.6 K/uL   RBC 4.20 (L) 4.40 - 5.90 MIL/uL   Hemoglobin 13.7 13.0 - 18.0 g/dL   HCT 16.1 09.6 - 04.5 %   MCV 96.5 80.0 - 100.0 fL   MCH 32.6 26.0 - 34.0 pg   MCHC 33.8 32.0 - 36.0 g/dL   RDW 40.9 81.1 - 91.4 %   Platelets 172 150 - 440 K/uL  Comprehensive metabolic panel     Status: Abnormal   Collection Time: 09/15/17  4:03 AM  Result Value Ref Range   Sodium 137 135 - 145 mmol/L   Potassium 3.5 3.5 - 5.1 mmol/L    Chloride 104 101 - 111 mmol/L   CO2 25 22 - 32 mmol/L   Glucose, Bld 122 (H) 65 - 99 mg/dL   BUN 22 (H) 6 - 20 mg/dL   Creatinine, Ser 7.82 0.61 - 1.24 mg/dL   Calcium 8.2 (L) 8.9 - 10.3 mg/dL   Total Protein 6.8 6.5 - 8.1 g/dL   Albumin 3.5 3.5 - 5.0 g/dL   AST 29 15 - 41 U/L   ALT 17 17 - 63 U/L   Alkaline Phosphatase 65 38 - 126 U/L   Total Bilirubin 0.8 0.3 - 1.2 mg/dL   GFR calc non Af Amer 58 (L) >60 mL/min   GFR calc Af Amer >60 >60 mL/min   Anion gap 8 5 - 15  Glucose, capillary     Status: Abnormal   Collection Time: 09/15/17  7:28 AM  Result Value Ref Range   Glucose-Capillary 117 (H) 65 - 99 mg/dL   Dg Chest 2 View  Result Date: 09/14/2017 CLINICAL DATA:  76 year old male with a history of urinary tract infection EXAM: CHEST  2 VIEW COMPARISON:  09/14/2009 FINDINGS: Cardiomediastinal silhouette unchanged in size and contour. No evidence of central vascular congestion. No pneumothorax or pleural effusion. No interlobular septal thickening. No confluent airspace disease. Diffuse coarsening of interstitial markings. No displaced fracture. IMPRESSION: Chronic lung changes without evidence of superimposed acute cardiopulmonary disease. Electronically Signed   By: Gilmer Mor D.O.   On: 09/14/2017 11:43     ASSESSMENT:  1  PAF  Pt is a 76 yo with history of PAF  (CHADSVASC =3)  Hosp a few times in [place] in past 2 years for symptoms of weakness with this, SOB Visiting [place]  Came to ED yesterday with Afib with RVR  Now rates better on IV diltiazem  But still in atrial fib  Pt previously on flecanide 50 bid  With age, risks for CAD  Reluctant to increase this   I have stopped it   Will plan to increase b blockade Echo to evaluate LV and atria   Keep on diltiazem IV The patient has been on Xarelto and has not missed any doses    If still in atrial fib in am plan for DCCV  Long term plan for rhythm control will need to be made  The patient has a cardiologist in [facility]   There is an EP physician in this group as well  Consider eval by him or another EP for other options in medical Rx vis ablation Note == the pt has history of sleep apnea  I told the pt and wife this needs to be readdressed/reevaluated  Need to try to treat if hope to decrease afib  2  HTN  BP is not well controlled  Increase b blockade  Continue dilt and ARB  Again, sleep apnea may be contributing to problem  Will need Rzx   3 Troponin  Minimal elevation probably reflects demand  He is comfortable  I am not convinced there is any active ischemia    4  Lipids   On Zetia  Recheck  With DM would benefit from aggressive Rx  And statin    5  DM  On oral agents

## 2017-09-15 NOTE — Plan of Care (Signed)
  Clinical Measurements: Ability to maintain clinical measurements within normal limits will improve -Patient remains on diltiazem gtt at 10mg /hr. HR remains stable in 90's-109. 09/15/2017 1822 - Progressing by Jake BatheKirkendall, Navy Belay D, RN   Pain Managment: General experience of comfort will improve -No c/o pain, will continue to monitor. 09/15/2017 1822 - Progressing by Jake BatheKirkendall, Emelie Newsom D, RN   Safety: Ability to remain free from injury will improve -bed alarm on, nonskid socks on, patient educated to call for assistance, hourly rounding completed on patient. 09/15/2017 1822 - Progressing by Jake BatheKirkendall, Nour Scalise D, RN   Cardiac: Ability to achieve and maintain adequate cardiopulmonary perfusion will improve 09/15/2017 1822 - Progressing by Heath LarkKirkendall, Shadara Lopez D, RN

## 2017-09-16 ENCOUNTER — Inpatient Hospital Stay (HOSPITAL_COMMUNITY)
Admit: 2017-09-16 | Discharge: 2017-09-16 | Disposition: A | Discharge status: Home / Self Care | Payer: Medicare Other | Attending: Internal Medicine | Admitting: Internal Medicine

## 2017-09-16 ENCOUNTER — Telehealth: Payer: Self-pay

## 2017-09-16 DIAGNOSIS — I4891 Unspecified atrial fibrillation: Secondary | ICD-10-CM

## 2017-09-16 DIAGNOSIS — I361 Nonrheumatic tricuspid (valve) insufficiency: Secondary | ICD-10-CM

## 2017-09-16 LAB — BASIC METABOLIC PANEL
ANION GAP: 8 (ref 5–15)
BUN: 22 mg/dL — ABNORMAL HIGH (ref 6–20)
CALCIUM: 8.3 mg/dL — AB (ref 8.9–10.3)
CO2: 25 mmol/L (ref 22–32)
CREATININE: 1 mg/dL (ref 0.61–1.24)
Chloride: 101 mmol/L (ref 101–111)
Glucose, Bld: 113 mg/dL — ABNORMAL HIGH (ref 65–99)
Potassium: 3.7 mmol/L (ref 3.5–5.1)
SODIUM: 134 mmol/L — AB (ref 135–145)

## 2017-09-16 LAB — LIPID PANEL
Cholesterol: 162 mg/dL (ref 0–200)
HDL: 48 mg/dL (ref >40)
LDL CALC: 100 mg/dL — AB (ref 0–99)
Total CHOL/HDL Ratio: 3.4 RATIO
Triglycerides: 69 mg/dL (ref <150)
VLDL: 14 mg/dL (ref 0–40)

## 2017-09-16 LAB — HEMOGLOBIN A1C
Hgb A1c MFr Bld: 5.8 % — ABNORMAL HIGH (ref 4.8–5.6)
MEAN PLASMA GLUCOSE: 120 mg/dL

## 2017-09-16 LAB — ECHOCARDIOGRAM COMPLETE
HEIGHTINCHES: 74 in
Weight: 4014.4 oz

## 2017-09-16 LAB — CBC
HCT: 39.4 % — ABNORMAL LOW (ref 40.0–52.0)
Hemoglobin: 13.2 g/dL (ref 13.0–18.0)
MCH: 32.4 pg (ref 26.0–34.0)
MCHC: 33.5 g/dL (ref 32.0–36.0)
MCV: 96.7 fL (ref 80.0–100.0)
PLATELETS: 187 10*3/uL (ref 150–440)
RBC: 4.07 MIL/uL — ABNORMAL LOW (ref 4.40–5.90)
RDW: 14.1 % (ref 11.5–14.5)
WBC: 11.6 10*3/uL — AB (ref 3.8–10.6)

## 2017-09-16 LAB — GLUCOSE, CAPILLARY
GLUCOSE-CAPILLARY: 97 mg/dL (ref 65–99)
GLUCOSE-CAPILLARY: 155 mg/dL — AB (ref 65–99)

## 2017-09-16 MED ORDER — HYDRALAZINE HCL 100 MG PO TABS: 100.0000 mg | ORAL_TABLET | Freq: Three times a day (TID) | ORAL | 0 refills | Status: AC

## 2017-09-16 MED ORDER — METOPROLOL TARTRATE 100 MG PO TABS: 100.0000 mg | ORAL_TABLET | Freq: Two times a day (BID) | ORAL | 0 refills | Status: AC

## 2017-09-16 MED ORDER — AZITHROMYCIN 500 MG PO TABS: 500.0000 mg | ORAL_TABLET | Freq: Every day | ORAL | 0 refills | Status: DC

## 2017-09-16 MED ORDER — METOPROLOL TARTRATE 50 MG PO TABS: 100.0000 mg | ORAL_TABLET | Freq: Two times a day (BID) | ORAL | Status: DC

## 2017-09-16 MED ORDER — DILTIAZEM HCL ER COATED BEADS 120 MG PO CP24: 120.0000 mg | ORAL_CAPSULE | Freq: Every day | ORAL | 0 refills | Status: AC

## 2017-09-16 MED ORDER — DILTIAZEM HCL ER COATED BEADS 120 MG PO CP24: 120.0000 mg | ORAL_CAPSULE | Freq: Every day | ORAL | 0 refills | Status: DC

## 2017-09-16 MED ORDER — HYDRALAZINE HCL 50 MG PO TABS
100.0000 mg | ORAL_TABLET | Freq: Three times a day (TID) | ORAL | Status: DC
  Administered 2017-09-16: 100 mg via ORAL
  Filled 2017-09-16: qty 2

## 2017-09-16 MED ORDER — DILTIAZEM HCL ER COATED BEADS 120 MG PO CP24
120.0000 mg | ORAL_CAPSULE | Freq: Every day | ORAL | Status: DC
  Administered 2017-09-16: 120 mg via ORAL
  Filled 2017-09-16: qty 1

## 2017-09-16 MED ORDER — METOPROLOL TARTRATE 100 MG PO TABS: 100.0000 mg | ORAL_TABLET | Freq: Two times a day (BID) | ORAL | 0 refills | Status: DC

## 2017-09-16 MED ORDER — HYDRALAZINE HCL 100 MG PO TABS: 100.0000 mg | ORAL_TABLET | Freq: Three times a day (TID) | ORAL | 0 refills | Status: DC

## 2017-09-16 MED ORDER — AZITHROMYCIN 500 MG PO TABS: 500.0000 mg | ORAL_TABLET | Freq: Every day | ORAL | 0 refills | Status: AC

## 2017-09-16 MED ORDER — HYDRALAZINE HCL 50 MG PO TABS
50.0000 mg | ORAL_TABLET | Freq: Once | ORAL | Status: AC
  Administered 2017-09-16: 50 mg via ORAL
  Filled 2017-09-16: qty 1

## 2017-09-16 NOTE — Progress Notes (Signed)
Nutrition Brief Note  Patient identified on the Malnutrition Screening Tool (MST) Report  76 y.o. male has a past medical history significant for PAF, HTN, DM, and [place]LD [place] to ER with palpitations associated with CP, SOB, and cough.  Wt Readings from Last 15 Encounters:  09/16/17 250 lb 14.4 oz (113.8 kg)    Body mass index is 32.21 kg/m. Patient meets criteria for obese based on current BMI. Per chart, pt with weight gain over the past year.   Current diet order is 2 gram sodium restriction, patient is consuming approximately 100% of meals at this time. Labs and medications reviewed.   No nutrition interventions warranted at this time. If nutrition issues arise, please consult RD.   Betsey Holidayasey Tiaira Arambula MS, RD, LDN Pager #- [phone] After Hours Pager: [phone]

## 2017-09-16 NOTE — Discharge Instructions (Signed)
Metoprolol tablets What is this medicine? METOPROLOL (me TOE proe lole) is a beta-blocker. Beta-blockers reduce the workload on the heart and help it to beat more regularly. This medicine is used to treat high blood pressure and to prevent chest pain. It is also used to after a heart attack and to prevent an additional heart attack from occurring. This medicine may be used for other purposes; ask your health care provider or pharmacist if you have questions. COMMON BRAND NAME(S): Lopressor What should I tell my health care provider before I take this medicine? They need to know if you have any of these conditions: -diabetes -heart or vessel disease like slow heart rate, worsening heart failure, heart block, sick sinus syndrome or Raynaud's disease -kidney disease -liver disease -lung or breathing disease, like asthma or emphysema -pheochromocytoma -thyroid disease -an unusual or allergic reaction to metoprolol, other beta-blockers, medicines, foods, dyes, or preservatives -pregnant or trying to get pregnant -breast-feeding How should I use this medicine? Take this medicine by mouth with a drink of water. Follow the directions on the prescription label. Take this medicine immediately after meals. Take your doses at regular intervals. Do not take more medicine than directed. Do not stop taking this medicine suddenly. This could lead to serious heart-related effects. Talk to your pediatrician regarding the use of this medicine in children. Special care may be needed. Overdosage: If you think you have taken too much of this medicine contact a poison control center or emergency room at once. NOTE: This medicine is only for you. Do not share this medicine with others. What if I miss a dose? If you miss a dose, take it as soon as you can. If it is almost time for your next dose, take only that dose. Do not take double or extra doses. What may interact with this medicine? This medicine may interact  with the following medications: -certain medicines for blood pressure, heart disease, irregular heart beat -certain medicines for depression like monoamine oxidase (MAO) inhibitors, fluoxetine, or paroxetine -clonidine -dobutamine -epinephrine -isoproterenol -reserpine This list may not describe all possible interactions. Give your health care provider a list of all the medicines, herbs, non-prescription drugs, or dietary supplements you use. Also tell them if you smoke, drink alcohol, or use illegal drugs. Some items may interact with your medicine. What should I watch for while using this medicine? Visit your doctor or health care professional for regular check ups. Contact your doctor right away if your symptoms worsen. Check your blood pressure and pulse rate regularly. Ask your health care professional what your blood pressure and pulse rate should be, and when you should contact them. You may get drowsy or dizzy. Do not drive, use machinery, or do anything that needs mental alertness until you know how this medicine affects you. Do not sit or stand up quickly, especially if you are an older patient. This reduces the risk of dizzy or fainting spells. Contact your doctor if these symptoms continue. Alcohol may interfere with the effect of this medicine. Avoid alcoholic drinks. What side effects may I notice from receiving this medicine? Side effects that you should report to your doctor or health care professional as soon as possible: -allergic reactions like skin rash, itching or hives -cold or numb hands or feet -depression -difficulty breathing -faint -fever with sore throat -irregular heartbeat, chest pain -rapid weight gain -swollen legs or ankles Side effects that usually do not require medical attention (report to your doctor or health care professional  if they continue or are bothersome): -anxiety or nervousness -change in sex drive or performance -dry  skin -headache -nightmares or trouble sleeping -short term memory loss -stomach upset or diarrhea -unusually tired This list may not describe all possible side effects. Call your doctor for medical advice about side effects. You may report side effects to FDA at 1-800-FDA-1088. Where should I keep my medicine? Keep out of the reach of children. Store at room temperature between 15 and 30 degrees C (59 and 86 degrees F). Throw away any unused medicine after the expiration date. NOTE: This sheet is a summary. It may not cover all possible information. If you have questions about this medicine, talk to your doctor, pharmacist, or health care provider.  2018 Elsevier/Gold Standard (2013-06-12 14:40:36) Diltiazem extended-release capsules or tablets What is this medicine? DILTIAZEM (dil TYE a zem) is a calcium-channel blocker. It affects the amount of calcium found in your heart and muscle cells. This relaxes your blood vessels, which can reduce the amount of work the heart has to do. This medicine is used to treat high blood pressure and chest pain caused by angina. This medicine may be used for other purposes; ask your health care provider or pharmacist if you have questions. COMMON BRAND NAME(S): Cardizem CD, Cardizem LA, Cardizem SR, Cartia XT, Dilacor XR, Dilt-CD, Diltia XT, Diltzac, Matzim LA, Verna Czechaztia XT, Tiamate, Tiazac What should I tell my health care provider before I take this medicine? They need to know if you have any of these conditions: -heart problems, low blood pressure, irregular heartbeat -liver disease -previous heart attack -an unusual or allergic reaction to diltiazem, other medicines, foods, dyes, or preservatives -pregnant or trying to get pregnant -breast-feeding How should I use this medicine? Take this medicine by mouth with a glass of water. Follow the directions on the prescription label. Swallow whole, do not crush or chew. Ask your doctor or pharmacist if your  should take this medicine with food. Take your doses at regular intervals. Do not take your medicine more often then directed. Do not stop taking except on the advice of your doctor or health care professional. Ask your doctor or health care professional how to gradually reduce the dose. Talk to your pediatrician regarding the use of this medicine in children. Special care may be needed. Overdosage: If you think you have taken too much of this medicine contact a poison control center or emergency room at once. NOTE: This medicine is only for you. Do not share this medicine with others. What if I miss a dose? If you miss a dose, take it as soon as you can. If it is almost time for your next dose, take only that dose. Do not take double or extra doses. What may interact with this medicine? Do not take this medicine with any of the following medications: -cisapride -hawthorn -pimozide -ranolazine -red yeast rice This medicine may also interact with the following medications: -buspirone -carbamazepine -cimetidine -cyclosporine -digoxin -local anesthetics or general anesthetics -lovastatin -medicines for anxiety or difficulty sleeping like midazolam and triazolam -medicines for high blood pressure or heart problems -quinidine -rifampin, rifabutin, or rifapentine This list may not describe all possible interactions. Give your health care provider a list of all the medicines, herbs, non-prescription drugs, or dietary supplements you use. Also tell them if you smoke, drink alcohol, or use illegal drugs. Some items may interact with your medicine. What should I watch for while using this medicine? Check your blood pressure and pulse rate  regularly. Ask your doctor or health care professional what your blood pressure and pulse rate should be and when you should contact him or her. You may feel dizzy or lightheaded. Do not drive, use machinery, or do anything that needs mental alertness until you  know how this medicine affects you. To reduce the risk of dizzy or fainting spells, do not sit or stand up quickly, especially if you are an older patient. Alcohol can make you more dizzy or increase flushing and rapid heartbeats. Avoid alcoholic drinks. What side effects may I notice from receiving this medicine? Side effects that you should report to your doctor or health care professional as soon as possible: -allergic reactions like skin rash, itching or hives, swelling of the face, lips, or tongue -confusion, mental depression -feeling faint or lightheaded, falls -redness, blistering, peeling or loosening of the skin, including inside the mouth -slow, irregular heartbeat -swelling of the feet and ankles -unusual bleeding or bruising, pinpoint red spots on the skin Side effects that usually do not require medical attention (report to your doctor or health care professional if they continue or are bothersome): -constipation or diarrhea -difficulty sleeping -facial flushing -headache -nausea, vomiting -sexual dysfunction -weak or tired This list may not describe all possible side effects. Call your doctor for medical advice about side effects. You may report side effects to FDA at 1-800-FDA-1088. Where should I keep my medicine? Keep out of the reach of children. Store at room temperature between 15 and 30 degrees C (59 and 86 degrees F). Protect from humidity. Throw away any unused medicine after the expiration date. NOTE: This sheet is a summary. It may not cover all possible information. If you have questions about this medicine, talk to your doctor, pharmacist, or health care provider.  2018 Elsevier/Gold Standard (2008-01-29 14:35:47) Atrial Fibrillation Atrial fibrillation is a type of heartbeat that is irregular or fast (rapid). If you have this condition, your heart keeps quivering in a weird (chaotic) way. This condition can make it so your heart cannot pump blood normally.  Having this condition gives a person more risk for stroke, heart failure, and other heart problems. There are different types of atrial fibrillation. Talk with your doctor to learn about the type that you have. Follow these instructions at home:  Take over-the-counter and prescription medicines only as told by your doctor.  If your doctor prescribed a blood-thinning medicine, take it exactly as told. Taking too much of it can cause bleeding. If you do not take enough of it, you will not have the protection that you need against stroke and other problems.  Do not use any tobacco products. These include cigarettes, chewing tobacco, and e-cigarettes. If you need help quitting, ask your doctor.  If you have apnea (obstructive sleep apnea), manage it as told by your doctor.  Do not drink alcohol.  Do not drink beverages that have caffeine. These include coffee, soda, and tea.  Maintain a healthy weight. Do not use diet pills unless your doctor says they are safe for you. Diet pills may make heart problems worse.  Follow diet instructions as told by your doctor.  Exercise regularly as told by your doctor.  Keep all follow-up visits as told by your doctor. This is important. Contact a doctor if:  You notice a change in the speed, rhythm, or strength of your heartbeat.  You are taking a blood-thinning medicine and you notice more bruising.  You get tired more easily when you move or  exercise. Get help right away if:  You have pain in your chest or your belly (abdomen).  You have sweating or weakness.  You feel sick to your stomach (nauseous).  You notice blood in your throw up (vomit), poop (stool), or pee (urine).  You are short of breath.  You suddenly have swollen feet and ankles.  You feel dizzy.  Your suddenly get weak or numb in your face, arms, or legs, especially if it happens on one side of your body.  You have trouble talking, trouble understanding, or both.  Your  face or your eyelid droops on one side. These symptoms may be an emergency. Do not wait to see if the symptoms will go away. Get medical help right away. Call your local emergency services (911 in the [place] Do not drive yourself to the hospital. This information is not intended to replace advice given to you by your health care provider. Make sure you discuss any questions you have with your health care provider. Document Released: 07/17/2008 Document Revised: 03/15/2016 Document Reviewed: 02/02/2015 Elsevier Interactive Patient Education  [employer].

## 2017-09-16 NOTE — Progress Notes (Signed)
Patients bp elevated. Dr. Sherryll BurgerShah notified to question discharge. Stated that it was ok to go ahead and discharge patient. Dr. Sherryll BurgerShah called into room and spoke with patient and wife regarding discharge. Will proceed.  Vitals:   09/16/17 1506 09/16/17 1630  BP: (!) 176/77 (!) 173/79  Pulse: 64 66  Resp:    Temp:    SpO2:

## 2017-09-16 NOTE — Progress Notes (Signed)
*  PRELIMINARY RESULTS* Echocardiogram 2D Echocardiogram has been performed.  Lucas Foster, Lucas Foster 09/16/2017, 9:54 AM

## 2017-09-16 NOTE — Care Management Important Message (Signed)
Important Message  Patient Details  Name: Lucas Foster MRN: [id number] Date of Birth: 1941-04-02   Medicare Important Message Given:  N/A - LOS <3 / Initial given by admissions    Eber HongGreene, Koralee Wedeking R, RN 09/16/2017, 11:28 AM

## 2017-09-16 NOTE — Progress Notes (Signed)
Progress Note  Patient Name: Lucas Foster Date of Encounter: 09/16/2017  [facility] Cardi[facility] [place]  Subjective   No chest pain or SOB. Converted to sinus rhythm at 22:03 on 11/25 on Cardizem gtt and Lopressor. Remained in Xarelto (no recent missed doses).   Inpatient Medications    Scheduled Meds: . citalopram  40 mg Oral Daily  . docusate sodium  100 mg Oral BID  . donepezil  10 mg Oral Daily  . ezetimibe  10 mg Oral Daily  . hydrALAZINE  50 mg Oral TID  . insulin aspart  0-9 Units Subcutaneous TID WC  . losartan  50 mg Oral BID  . metFORMIN  500 mg Oral Q breakfast  . metoprolol tartrate  50 mg Oral QID  . oxybutynin  5 mg Oral Daily  . pantoprazole (PROTONIX) IV  40 mg Intravenous Q12H  . primidone  250 mg Oral BID  . rivaroxaban  20 mg Oral Daily  . tamsulosin  0.4 mg Oral BID   Continuous Infusions: . diltiazem (CARDIZEM) infusion 10 mg/hr (09/15/17 2226)   PRN Meds: acetaminophen **OR** acetaminophen, bisacodyl, hydrALAZINE, ipratropium-albuterol, morphine injection, nitroGLYCERIN, ondansetron **OR** ondansetron (ZOFRAN) IV   Vital Signs    Vitals:   09/15/17 1707 09/15/17 1932 09/16/17 0407 09/16/17 0818  BP: 132/82 (!) 159/86 (!) 158/76 (!) 168/75  Pulse: 95 97 67 64  Resp:  18 17 18   Temp:  97.9 F (36.6 C) 97.8 F (36.6 C) 98.1 F (36.7 C)  TempSrc:  Oral Oral Oral  SpO2:  97% 94% 95%  Weight:   250 lb 14.4 oz (113.8 kg)   Height:        Intake/Output Summary (Last 24 hours) at 09/16/2017 0952 Last data filed at 09/16/2017 0500 Gross per 24 hour  Intake 513.67 ml  Output 2300 ml  Net -1786.33 ml   Filed Weights   09/14/17 1117 09/14/17 1615 09/16/17 0407  Weight: 250 lb (113.4 kg) 253 lb 4.9 oz (114.9 kg) 250 lb 14.4 oz (113.8 kg)    Telemetry    Converted to NSR at 22:03 on 11/25. Has remained in sinus rhythm since - Personally Reviewed  ECG    NSR, 64 bpm, occasional PVCs, nonspecific st/t  changes - Personally Reviewed  Physical Exam   GEN: No acute distress.   Neck: No JVD. Cardiac: RRR, no murmurs, rubs, or gallops.  Respiratory: Clear to auscultation bilaterally.  GI: Soft, nontender, non-distended.   MS: No edema; No deformity. Neuro:  Alert and oriented x 3; Nonfocal.  Psych: Normal affect.  Labs    Chemistry Recent Labs  Lab 09/14/17 1142 09/15/17 0403 09/16/17 0527  NA 137 137 134*  K 3.7 3.5 3.7  CL 100* 104 101  CO2 26 25 25   GLUCOSE 124* 122* 113*  BUN 18 22* 22*  CREATININE 1.38* 1.18 1.00  CALCIUM 8.6* 8.2* 8.3*  PROT  --  6.8  --   ALBUMIN  --  3.5  --   AST  --  29  --   ALT  --  17  --   ALKPHOS  --  65  --   BILITOT  --  0.8  --   GFRNONAA 48* 58* >60  GFRAA 56* >60 >60  ANIONGAP 11 8 8      Hematology Recent Labs  Lab 09/14/17 1142 09/15/17 0403 09/16/17 0527  WBC 14.7* 13.0* 11.6*  RBC 4.38* 4.20* 4.07*  HGB 14.4 13.7 13.2  HCT 42.6 40.6 39.4*  MCV 97.3 96.5 96.7  MCH 32.9 32.6 32.4  MCHC 33.8 33.8 33.5  RDW 14.4 13.9 14.1  PLT 200 172 187    Cardiac Enzymes Recent Labs  Lab 09/14/17 1142 09/14/17 1655 09/14/17 2149 09/15/17 0403  TROPONINI 0.05* 0.38* 0.38* 0.27*   No results for input(s): TROPIPOC in the last 168 hours.   BNP Recent Labs  Lab 09/14/17 1142  BNP 1,021.0*     DDimer No results for input(s): DDIMER in the last 168 hours.   Radiology    Dg Chest 2 View  Result Date: 09/14/2017 IMPRESSION: Chronic lung changes without evidence of superimposed acute cardiopulmonary disease. Electronically Signed   By: Gilmer MorJaime  Wagner D.O.   On: 09/14/2017 11:43    Cardiac Studies   TTE pending.   Patient Profile     76 y.o. male with history of PAF on Xarelto, HTN, DM, HLD [place] and [place] with Afib with RVR.   Assessment & Plan    1. PAF with RVR: -Admitted in rapid Afib with heart rates into the 130s bpm -PTA Flecainide stopped given risk for CAD -Rate controlled with  Lopressor -Converted to NSR at 22:03 on 11/25 -Consolidate Lopressor to 100 mg bid -Stop Cardizem gtt, transition to PO Cardizem 120 mg daily -Xarelto  -CHADS2VASc at least 4 (HTN, age x 2, DM) -Needs follow up in [place]  2. HTN: -Poorly controlled -Titrate hydralazine to 100 mg tid -Rash with amlodipine, has tolerated Cardizem gtt without issues -Continue losartan 50 mg bid and Lopressor 100 mg bid as above -Likely exacerbated by sleep apnea  3. HLD: -Zetia  4. Sleep apnea: -Prior sleep study showed OSA, not on CPAP -Needs outpatient follow up in [place]  5. Elevated troponin: -Mildly elevated with a peak of 0.38 -Felt to be supply demand ischemia in the setting of Afib with RVR -Echo pending -Flecainide stopped as above given concern for possible CAD -If echo shows preserved LVSF, needs outpatient ischemia follow up in [place] -Discontinue nitropaste, can use Imdur as needed for BP  For questions or updates, please contact [facility] Please consult [email or web address] for contact info under Cardiology/STEMI.    Signed, Eula Listenyan Okey Zelek, PA-C [facility] Pager: [phone] 09/16/2017, 9:52 AM

## 2017-09-16 NOTE — Telephone Encounter (Signed)
TCM....  Patient is being discharged today   They saw initially Dr. Tenny Crawoss then Ryan/Arida   They are scheduled to see Alycia RossettiRyan on 12/6  They were seen for Afib RVR  They need to be seen within asap [facility]

## 2017-09-16 NOTE — Progress Notes (Signed)
Patient discharged via wheelchair and private vehicle. IV removed and catheter intact. All discharge instructions given and patient verbalizes understanding. Tele removed and returned. Prescriptions faxed to [facility] on [address]. No distress noted at time of discharge.

## 2017-09-17 LAB — HEMOGLOBIN A1C
Hgb A1c MFr Bld: 5.3 % (ref 4.8–5.6)
Mean Plasma Glucose: 105 mg/dL

## 2017-09-17 NOTE — Discharge Summary (Signed)
[facility] - [place] at [facility]   PATIENT NAME: Lucas Foster    MR#:  [id number]  DATE OF BIRTH:  02-21-1941  DATE OF ADMISSION:  09/14/2017   ADMITTING PHYSICIAN: Marguarite Arbour, MD  DATE OF DISCHARGE: 09/16/2017  6:00 PM  PRIMARY CARE PHYSICIAN: System, Provider Not In   ADMISSION DIAGNOSIS:  Cough [R05] Atrial fibrillation with rapid ventricular response (HCC) [I48.91] DISCHARGE DIAGNOSIS:  Principal Problem:   Atrial fibrillation with RVR (HCC) Active Problems:   Chest pain   Elevated troponin   Accelerated hypertension  SECONDARY DIAGNOSIS:   Past Medical History:  Diagnosis Date  . A-fib (HCC)   . Anxiety   . Diabetes mellitus without complication (HCC)   . Essential tremor   . Herpes simplex   . High cholesterol   . Hypertension   . Incontinence   . Memory loss   . 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 Reflux esophagitis    HOSPITAL COURSE:  76 y.o. male with history of PAF on Xarelto, HTN, DM, HLD who is visiting from [facility] admitted with Afib with RVR.   1. PAF with RVR: -Admitted in rapid Afib with heart rates into the 130s bpm -Rate controlled with Lopressor -Converted to NSR at 22:03 on 11/25 -Consolidate Lopressor to 100 mg bid -Stopped Cardizem gtt which was started on admission, transitioned to PO Cardizem 120 mg daily -Xarelto started -CHADS2VASc at least 4 (HTN, age x 2, DM) -Needs follow up in [facility] with his cardiologist and PCP  2. HTN: -Poorly controlled -Titrated to hydralazine to 100 mg tid and being D/Ced on same -Rash with amlodipine, has tolerated Cardizem gtt without issues -Continue losartan 50 mg bid and Lopressor 100 mg bid as above -Likely exacerbated by sleep apnea  3. HLD: -Zetia  4. Sleep apnea: -Prior sleep study showed OSA, not on CPAP -Needs outpatient follow up in [place]  5. Elevated troponin: -Mildly elevated with a peak of 0.38 -Felt to be supply demand ischemia in the  setting of Afib with RVR -Echo shows EF of 50%, possible inferior wall hypokinesis and moderate pulmonary hypertension.  There was moderate biatrial enlargement -Flecainide stopped as above given concern for possible CAD  DISCHARGE CONDITIONS:  stable CONSULTS OBTAINED:  Treatment Team:  Pricilla Riffle, MD DRUG ALLERGIES:   Allergies  Allergen Reactions  . Furosemide   . Norvasc [Amlodipine Besylate]     rash  . Rocephin [Ceftriaxone Sodium In Dextrose]     rash  . Spironolactone     rash  . Valsartan   . Vancomycin     Kidney failure   DISCHARGE MEDICATIONS:   Allergies as of 09/16/2017      Reactions   Furosemide    Norvasc [amlodipine Besylate]    rash   Rocephin [ceftriaxone Sodium In Dextrose]    rash   Spironolactone    rash   Valsartan    Vancomycin    Kidney failure      Medication List    STOP taking these medications   flecainide 150 MG tablet Commonly known as:  TAMBOCOR   flecainide 50 MG tablet Commonly known as:  TAMBOCOR   metoprolol succinate 50 MG 24 hr tablet Commonly known as:  TOPROL-XL     TAKE these medications   azithromycin 500 MG tablet Commonly known as:  ZITHROMAX Take 1 tablet (500 mg total) by mouth daily for 5 days. Take 1 tablet daily for 5 days.  citalopram 40 MG tablet Commonly known as:  CELEXA Take 40 mg by mouth daily.   cloNIDine 0.1 MG tablet Commonly known as:  CATAPRES Take 0.1 mg by mouth as needed. Take if blood pressure > 180.   diltiazem 120 MG 24 hr capsule Commonly known as:  CARDIZEM CD Take 1 capsule (120 mg total) by mouth daily.   donepezil 10 MG tablet Commonly known as:  ARICEPT Take 10 mg by mouth daily.   esomeprazole 40 MG capsule Commonly known as:  NEXIUM Take 40 mg by mouth 2 (two) times daily.   ezetimibe 10 MG tablet Commonly known as:  ZETIA Take 10 mg by mouth daily.   hydrALAZINE 100 MG tablet Commonly known as:  APRESOLINE Take 1 tablet (100 mg total) by mouth 3 (three)  times daily. What changed:    medication strength  how much to take   isosorbide mononitrate 60 MG 24 hr tablet Commonly known as:  IMDUR Take 60 mg by mouth daily.   losartan 50 MG tablet Commonly known as:  COZAAR Take 50 mg by mouth 2 (two) times daily.   metFORMIN 500 MG tablet Commonly known as:  GLUCOPHAGE Take 500 mg by mouth daily.   metoprolol tartrate 100 MG tablet Commonly known as:  LOPRESSOR Take 1 tablet (100 mg total) by mouth 2 (two) times daily.   oxybutynin 5 MG 24 hr tablet Commonly known as:  DITROPAN-XL Take 5 mg by mouth daily.   primidone 250 MG tablet Commonly known as:  MYSOLINE Take 250 mg by mouth 2 (two) times daily.   tamsulosin 0.4 MG Caps capsule Commonly known as:  FLOMAX Take 0.4 mg by mouth 2 (two) times daily.   valACYclovir 500 MG tablet Commonly known as:  VALTREX Take 500 mg by mouth daily as needed.   XARELTO 20 MG Tabs tablet Generic drug:  rivaroxaban Take 20 mg by mouth daily.        DISCHARGE INSTRUCTIONS:   DIET:  Cardiac diet DISCHARGE CONDITION:  Stable ACTIVITY:  Activity as tolerated OXYGEN:  Home Oxygen: No.  Oxygen Delivery: room air DISCHARGE LOCATION:  home   If you experience worsening of your admission symptoms, develop shortness of breath, life threatening emergency, suicidal or homicidal thoughts you must seek medical attention immediately by calling 911 or calling your MD immediately  if symptoms less severe.  You Must read complete instructions/literature along with all the possible adverse reactions/side effects for all the Medicines you take and that have been prescribed to you. Take any new Medicines after you have completely understood and accpet all the possible adverse reactions/side effects.   Please note  You were cared for by a hospitalist during your hospital stay. If you have any questions about your discharge medications or the care you received while you were in the hospital after  you are discharged, you can call the unit and asked to speak with the hospitalist on call if the hospitalist that took care of you is not available. Once you are discharged, your primary care physician will handle any further medical issues. Please note that NO REFILLS for any discharge medications will be authorized once you are discharged, as it is imperative that you return to your primary care physician (or establish a relationship with a primary care physician if you do not have one) for your aftercare needs so that they can reassess your need for medications and monitor your lab values.    On the day of Discharge:  VITAL SIGNS:  Blood pressure (!) 173/79, pulse 66, temperature 98.1 F (36.7 C), temperature source Oral, resp. rate 18, height 6\' 2"  (1.88 m), weight 113.8 kg (250 lb 14.4 oz), SpO2 95 %. PHYSICAL EXAMINATION:  GENERAL:  76 y.o.-year-old patient lying in the bed with no acute distress.  EYES: Pupils equal, round, reactive to light and accommodation. No scleral icterus. Extraocular muscles intact.  HEENT: Head atraumatic, normocephalic. Oropharynx and nasopharynx clear.  NECK:  Supple, no jugular venous distention. No thyroid enlargement, no tenderness.  LUNGS: Normal breath sounds bilaterally, no wheezing, rales,rhonchi or crepitation. No use of accessory muscles of respiration.  CARDIOVASCULAR: S1, S2 normal. No murmurs, rubs, or gallops.  ABDOMEN: Soft, non-tender, non-distended. Bowel sounds present. No organomegaly or mass.  EXTREMITIES: No pedal edema, cyanosis, or clubbing.  NEUROLOGIC: Cranial nerves II through XII are intact. Muscle strength 5/5 in all extremities. Sensation intact. Gait not checked.  PSYCHIATRIC: The patient is alert and oriented x 3.  SKIN: No obvious rash, lesion, or ulcer.  DATA REVIEW:   CBC Recent Labs  Lab 09/16/17 0527  WBC 11.6*  HGB 13.2  HCT 39.4*  PLT 187    Chemistries  Recent Labs  Lab 09/15/17 0403 09/16/17 0527  NA 137  134*  K 3.5 3.7  CL 104 101  CO2 25 25  GLUCOSE 122* 113*  BUN 22* 22*  CREATININE 1.18 1.00  CALCIUM 8.2* 8.3*  AST 29  --   ALT 17  --   ALKPHOS 65  --   BILITOT 0.8  --      Follow-up Information    Iran OuchArida, Muhammad A, MD. Schedule an appointment as soon as possible for a visit in 1 week.   Specialty:  Cardiology Why:  Thursday, Dec. 6th at 10:30am Contact information: [address] [place] [id number] [phone]           Management plans discussed with the patient, family and they are in agreement.  CODE STATUS: Prior   TOTAL TIME TAKING CARE OF THIS PATIENT: 45 minutes.    Delfino LovettVipul Noreta Kue M.D on 09/17/2017 at 9:08 PM  Between 7am to 6pm - Pager - [phone]  After 6pm go to [email or web address] - Social research officer, governmentpassword [email or web address]  [facility]  Office  [phone]  CC: Primary care physician; System, Provider Not In   Note: This dictation was prepared with Dragon dictation along with smaller phrase technology. Any transcriptional errors that result from this process are unintentional.

## 2017-09-17 NOTE — Telephone Encounter (Signed)
No answer. Left message to call back.   

## 2017-09-19 NOTE — Telephone Encounter (Signed)
Spoke with patients wife and she [place] from [place] and this appointment was supposed to be canceled. She states that they are flying back home and will be following up with [place] cardiologist in [place]. She was appreciative for the call and had no further questions or concerns at this time. Appointment canceled in our system.

## 2017-09-19 NOTE — Telephone Encounter (Signed)
No answer. Rang several times and then no voicemail picked up.

## 2017-09-26 ENCOUNTER — Ambulatory Visit: Payer: Medicare Other | Admitting: Physician Assistant

## 2019-04-13 IMAGING — CR DG CHEST 2V
2 series · 3 of 3 positions shown · non-contrast
Comparison: 09/14/2009

CLINICAL DATA: 76-year-old male with a history of urinary tract
infection

EXAM:
CHEST  2 VIEW

[chest lat]
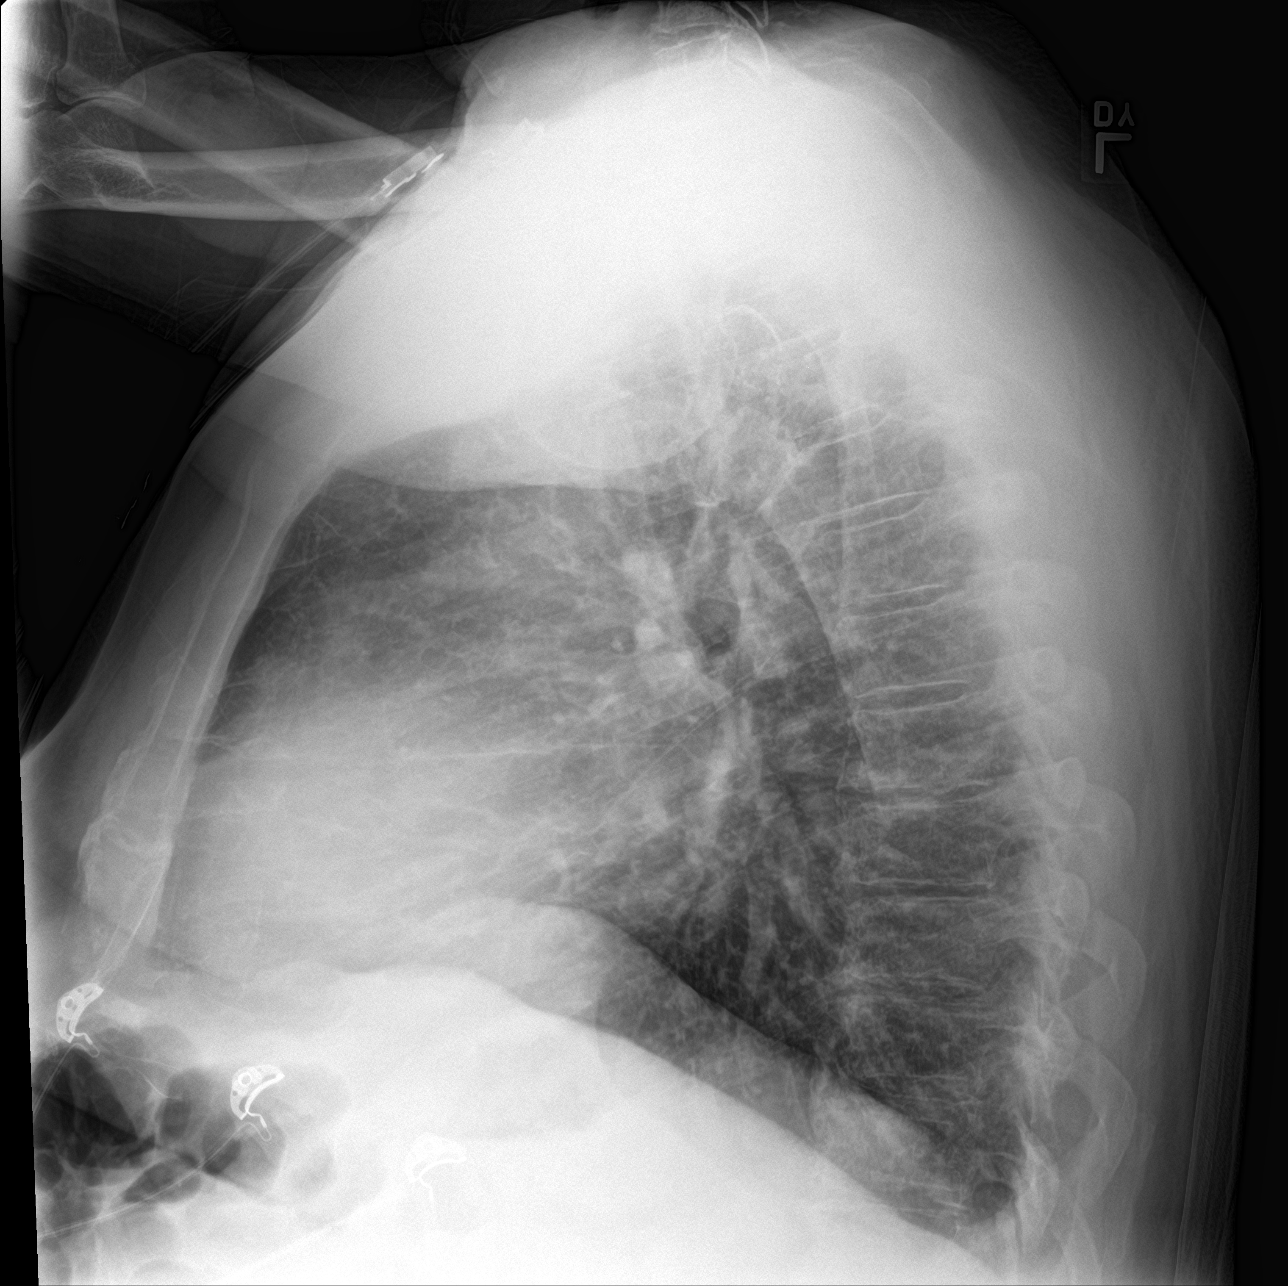

[Series 3: chest ap · 0.14mm/px · 2 of 2 slices shown]
[im 1/2]
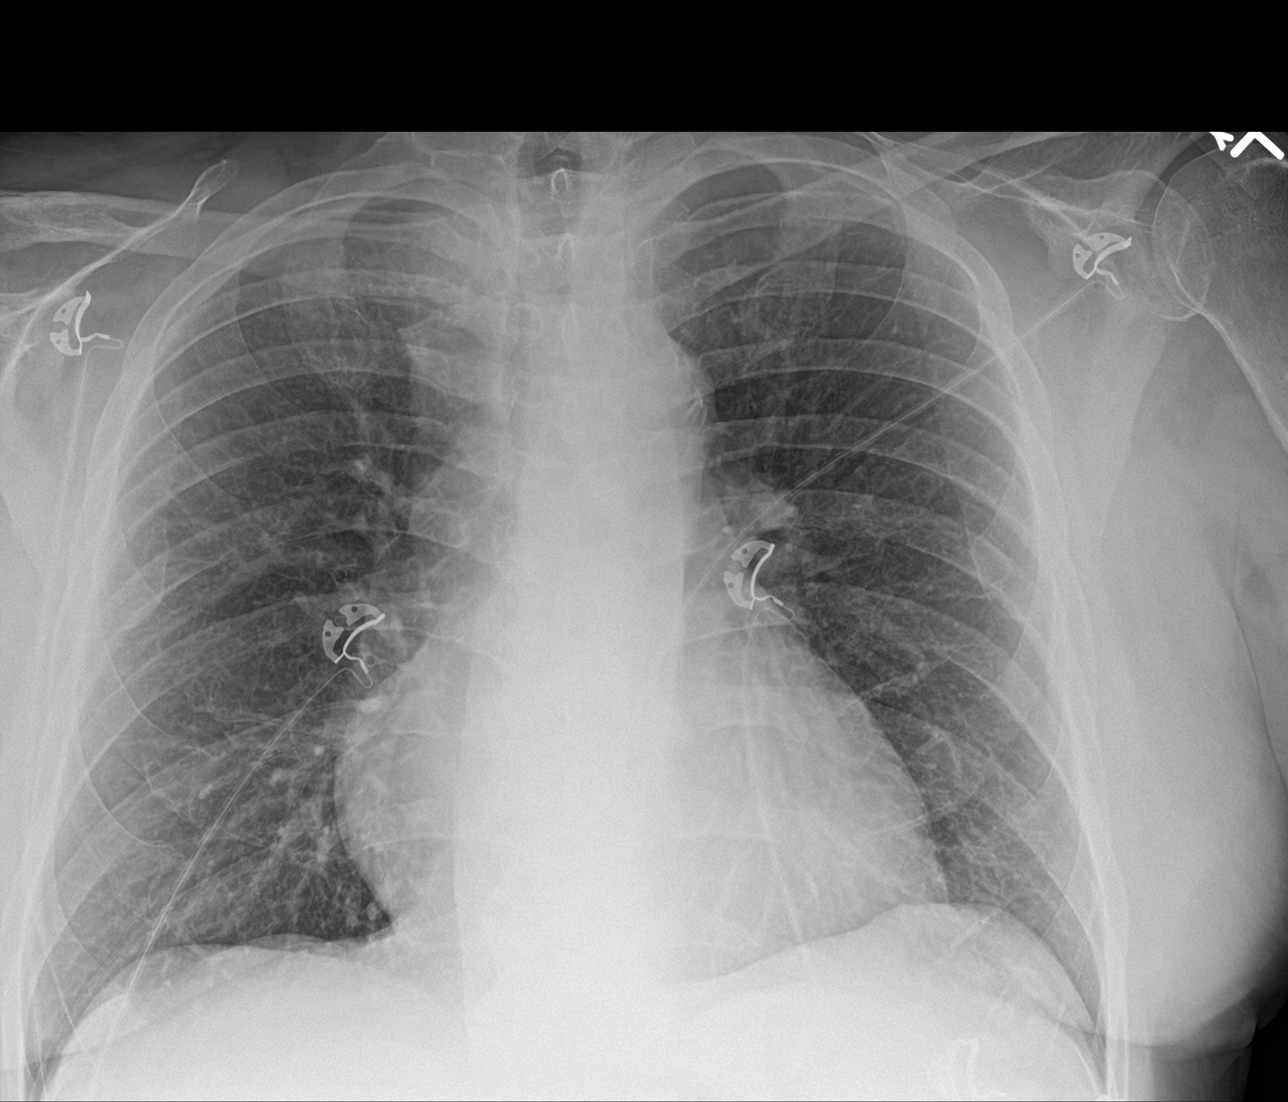
[im 2/2]
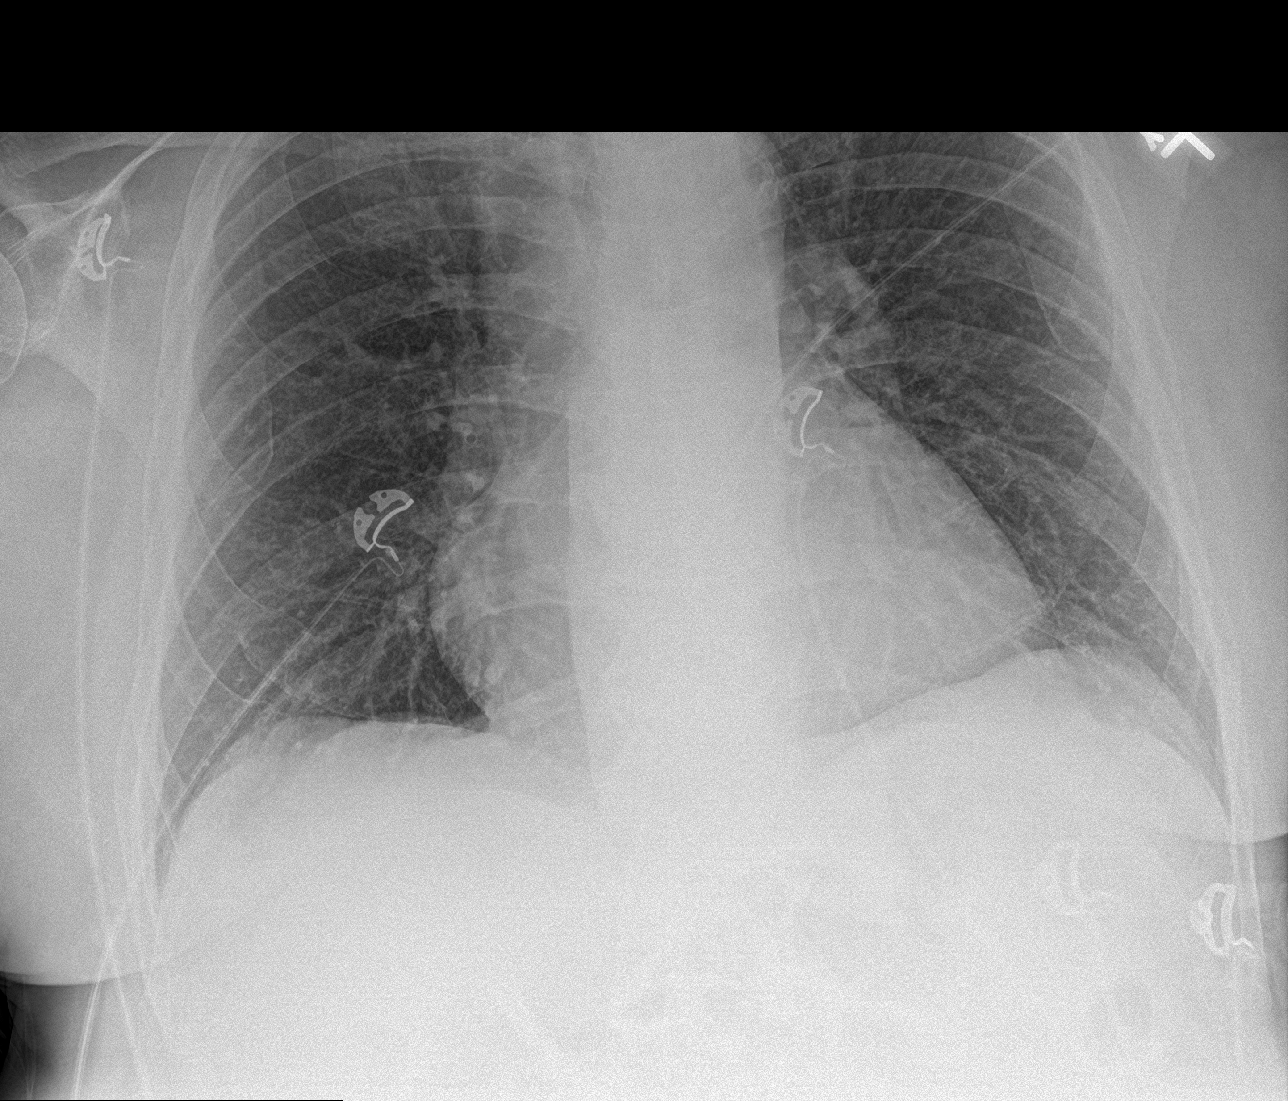

[3 of 3 positions shown; findings below may reference images not displayed]

FINDINGS: Cardiomediastinal silhouette unchanged in size and contour. No
evidence of central vascular congestion. No pneumothorax or pleural
effusion. No interlobular septal thickening.

No confluent airspace disease. Diffuse coarsening of interstitial
markings. No displaced fracture.
IMPRESSION: Chronic lung changes without evidence of superimposed acute
cardiopulmonary disease.
